# Patient Record
Sex: Female | Born: 1980 | Race: Black or African American | Hispanic: No | Marital: Single | Smoking: Former smoker
Health system: Southern US, Community
[De-identification: ages and names within clinical notes are randomized; demographics above are authoritative.]

## PROBLEM LIST (undated history)

## (undated) ENCOUNTER — Inpatient Hospital Stay (HOSPITAL_COMMUNITY): Payer: Self-pay

## (undated) DIAGNOSIS — A539 Syphilis, unspecified: Secondary | ICD-10-CM

## (undated) HISTORY — PX: NO PAST SURGERIES: SHX2092

---

## 2003-02-23 ENCOUNTER — Other Ambulatory Visit: Admission: RE | Admit: 2003-02-23 | Discharge: 2003-02-23 | Payer: Self-pay | Admitting: Obstetrics and Gynecology

## 2003-10-11 ENCOUNTER — Inpatient Hospital Stay (HOSPITAL_COMMUNITY): Admission: AD | Admit: 2003-10-11 | Discharge: 2003-10-12 | Payer: Self-pay | Admitting: Obstetrics and Gynecology

## 2003-10-25 ENCOUNTER — Ambulatory Visit (HOSPITAL_COMMUNITY): Admission: RE | Admit: 2003-10-25 | Discharge: 2003-10-25 | Payer: Self-pay | Admitting: Obstetrics and Gynecology

## 2003-10-27 ENCOUNTER — Inpatient Hospital Stay (HOSPITAL_COMMUNITY): Admission: AD | Admit: 2003-10-27 | Discharge: 2003-10-29 | Payer: Self-pay | Admitting: Obstetrics and Gynecology

## 2010-08-31 ENCOUNTER — Emergency Department (HOSPITAL_BASED_OUTPATIENT_CLINIC_OR_DEPARTMENT_OTHER)
Admission: EM | Admit: 2010-08-31 | Discharge: 2010-08-31 | Disposition: A | Discharge status: Home / Self Care | Payer: Medicaid Other | Attending: Emergency Medicine | Admitting: Emergency Medicine

## 2010-08-31 ENCOUNTER — Encounter: Payer: Self-pay | Admitting: *Deleted

## 2010-08-31 DIAGNOSIS — Z202 Contact with and (suspected) exposure to infections with a predominantly sexual mode of transmission: Secondary | ICD-10-CM | POA: Insufficient documentation

## 2010-08-31 DIAGNOSIS — N898 Other specified noninflammatory disorders of vagina: Secondary | ICD-10-CM | POA: Insufficient documentation

## 2010-08-31 DIAGNOSIS — O26899 Other specified pregnancy related conditions, unspecified trimester: Secondary | ICD-10-CM | POA: Insufficient documentation

## 2010-08-31 HISTORY — DX: Syphilis, unspecified: A53.9

## 2010-08-31 LAB — WET PREP, GENITAL: Trich, Wet Prep: NONE SEEN

## 2010-08-31 MED ORDER — CEFTRIAXONE SODIUM 250 MG IJ SOLR
250.0000 mg | Freq: Once | INTRAMUSCULAR | Status: AC
  Administered 2010-08-31: 250 mg via INTRAMUSCULAR
  Filled 2010-08-31: qty 250

## 2010-08-31 MED ORDER — LIDOCAINE HCL (PF) 1 % IJ SOLN
INTRAMUSCULAR | Status: AC
  Administered 2010-08-31: 05:00:00
  Filled 2010-08-31: qty 5

## 2010-08-31 MED ORDER — AZITHROMYCIN 1 G PO PACK
1.0000 g | PACK | Freq: Once | ORAL | Status: AC
  Administered 2010-08-31: 1 g via ORAL
  Filled 2010-08-31: qty 1

## 2010-08-31 NOTE — ED Notes (Signed)
Pt's partner was treated for Gonorrhea yesterday. Here for treatment. Pt is 6months pregnant.

## 2010-08-31 NOTE — ED Notes (Signed)
Pt states that her SO was tx for a STD and she is concerned states that she has had a vaginal DC greater than normal

## 2010-08-31 NOTE — ED Provider Notes (Addendum)
History     Chief Complaint  Patient presents with  . Exposure to STD   Patient is a 30 y.o. female presenting with vaginal discharge. The history is provided by the patient (The patient presents today requesting to be checked for an STD. She is currently since 6 months pregnant, gravida 4 para 2. Her partner has recently been treated for "gonorrhea". The patient is not sure whether she is having any abnormal discharge or not. ).  Vaginal Discharge The current episode started more than 2 days ago. Associated symptoms include abdominal pain. Pertinent negatives include no shortness of breath.    Past Medical History  Diagnosis Date  . Syphilis     History reviewed. No pertinent past surgical history.  History reviewed. No pertinent family history.  History  Substance Use Topics  . Smoking status: Former Games developer  . Smokeless tobacco: Not on file  . Alcohol Use: No    OB History    Grav Para Term Preterm Abortions TAB SAB Ect Mult Living   4 2   1            Review of Systems  Respiratory: Negative for shortness of breath.   Gastrointestinal: Positive for abdominal pain.  Genitourinary: Positive for vaginal discharge.  All other systems reviewed and are negative.    Physical Exam  BP 98/61  Pulse 80  Temp(Src) 98.3 F (36.8 C) (Oral)  Resp 16  Ht 5\' 5"  (1.651 m)  Wt 143 lb (64.864 kg)  BMI 23.80 kg/m2  Physical Exam  Constitutional: She appears well-developed and well-nourished. No distress.  HENT:  Head: Normocephalic.  Eyes: Pupils are equal, round, and reactive to light.  Cardiovascular: Normal heart sounds.   Pulmonary/Chest: Breath sounds normal.  Abdominal: Soft.  Genitourinary: Vagina normal.       Yellow discharge noted in the cervical area. No cervical motion tenderness. No bleeding noted no external lesions. The female nursing chaperone present fulguration of the entire examination.   Musculoskeletal: Normal range of motion.  Neurological: She is  alert.  Skin: Skin is warm and dry.    ED Course  Procedures  MDM Pt is seen and examined;  Initial history and physical completed.  Will follow.        Kit Mollett A. Patrica Duel, MD 08/31/10 1610  Lorelle Gibbs. Patrica Duel, MD 08/31/10 [id number]  Lorelle Gibbs. Patrica Duel, MD 08/31/10 [id number]

## 2010-09-01 LAB — GC/CHLAMYDIA PROBE AMP, GENITAL: GC Probe Amp, Genital: NEGATIVE

## 2013-10-05 ENCOUNTER — Encounter (HOSPITAL_BASED_OUTPATIENT_CLINIC_OR_DEPARTMENT_OTHER): Payer: Self-pay | Admitting: Emergency Medicine

## 2013-10-05 ENCOUNTER — Emergency Department (HOSPITAL_BASED_OUTPATIENT_CLINIC_OR_DEPARTMENT_OTHER)
Admission: EM | Admit: 2013-10-05 | Discharge: 2013-10-05 | Disposition: A | Discharge status: Home / Self Care | Payer: 59 | Attending: Emergency Medicine | Admitting: Emergency Medicine

## 2013-10-05 DIAGNOSIS — Z880 Allergy status to penicillin: Secondary | ICD-10-CM | POA: Diagnosis not present

## 2013-10-05 DIAGNOSIS — K089 Disorder of teeth and supporting structures, unspecified: Secondary | ICD-10-CM | POA: Diagnosis present

## 2013-10-05 DIAGNOSIS — Z8619 Personal history of other infectious and parasitic diseases: Secondary | ICD-10-CM | POA: Insufficient documentation

## 2013-10-05 DIAGNOSIS — K0889 Other specified disorders of teeth and supporting structures: Secondary | ICD-10-CM

## 2013-10-05 DIAGNOSIS — Z87891 Personal history of nicotine dependence: Secondary | ICD-10-CM | POA: Diagnosis not present

## 2013-10-05 MED ORDER — OXYCODONE-ACETAMINOPHEN 5-325 MG PO TABS: 1.0000 | ORAL_TABLET | ORAL | Status: DC | PRN

## 2013-10-05 NOTE — ED Provider Notes (Signed)
CSN: [id number]     Arrival date & time 10/05/13  1625 History   First MD Initiated Contact with Patient 10/05/13 1641     Chief Complaint  Patient presents with  . Dental Pain     (Consider location/radiation/quality/duration/timing/severity/associated sxs/prior Treatment) Patient is a 33 y.o. female presenting with tooth pain. The history is provided by the patient. No language interpreter was used.  Dental Pain Location:  Upper Upper teeth location:  1/RU 3rd molar Associated symptoms: no facial swelling, no fever and no headaches   Associated symptoms comment:  She is having pain at the partially erupted upper right molar. No facial swelling, fever.   Past Medical History  Diagnosis Date  . Syphilis    History reviewed. No pertinent past surgical history. History reviewed. No pertinent family history. History  Substance Use Topics  . Smoking status: Former Games developer  . Smokeless tobacco: Not on file  . Alcohol Use: No   OB History   Grav Para Term Preterm Abortions TAB SAB Ect Mult Living   Review of Systems  Constitutional: Negative for fever and chills.  HENT: Positive for dental problem. Negative for facial swelling and trouble swallowing.   Gastrointestinal: Negative.  Negative for nausea.  Neurological: Negative.  Negative for headaches.      Allergies  Penicillins  Home Medications   Prior to Admission medications   Not on File   BP 135/93  Pulse 91  Temp(Src) 98.5 F (36.9 C)  Resp 16  Ht  (1.651 m)  Wt 158 lb (71.668 kg)  BMI 26.29 kg/m2  SpO2 100%  Breastfeeding? Unknown Physical Exam  Constitutional: She is oriented to person, place, and time. She appears well-developed and well-nourished.  HENT:  Partially edentulous with upper partial plate. #1 not fully erupted. No surrounding inflammation or abscess formation.  Neck: Normal range of motion.  Pulmonary/Chest: Effort normal.  Neurological: She is alert and oriented  to person, place, and time.  Skin: Skin is warm and dry.    ED Course  Procedures (including critical care time) Labs Review Labs Reviewed - No data to display  Imaging Review No results found.   EKG Interpretation None      MDM   Final diagnoses:  None    1. Dental pain  Will provide pain management for dental pain. Encouraged follow up with dentistry. Do not suspect abscess.     Arnoldo Hooker, PA-C 10/05/13 1713

## 2013-10-05 NOTE — ED Provider Notes (Signed)
Medical screening examination/treatment/procedure(s) were performed by non-physician practitioner and as supervising physician I was immediately available for consultation/collaboration.   EKG Interpretation None        Richardean Canal, MD 10/05/13 2333

## 2013-10-05 NOTE — Discharge Instructions (Signed)
Dental Pain °A tooth ache may be caused by cavities (tooth decay). Cavities expose the nerve of the tooth to air and hot or cold temperatures. It may come from an infection or abscess (also called a boil or furuncle) around your tooth. It is also often caused by dental caries (tooth decay). This causes the pain you are having. °DIAGNOSIS  °Your caregiver can diagnose this problem by exam. °TREATMENT  °· If caused by an infection, it may be treated with medications which kill germs (antibiotics) and pain medications as prescribed by your caregiver. Take medications as directed. °· Only take over-the-counter or prescription medicines for pain, discomfort, or fever as directed by your caregiver. °· Whether the tooth ache today is caused by infection or dental disease, you should see your dentist as soon as possible for further care. °SEEK MEDICAL CARE IF: °The exam and treatment you received today has been provided on an emergency basis only. This is not a substitute for complete medical or dental care. If your problem worsens or new problems (symptoms) appear, and you are unable to meet with your dentist, call or return to this location. °SEEK IMMEDIATE MEDICAL CARE IF:  °· You have a fever. °· You develop redness and swelling of your face, jaw, or neck. °· You are unable to open your mouth. °· You have severe pain uncontrolled by pain medicine. °MAKE SURE YOU:  °· Understand these instructions. °· Will watch your condition. °· Will get help right away if you are not doing well or get worse. °Document Released: 01/21/2005 Document Revised: 04/15/2011 Document Reviewed: 09/09/2007 °ExitCare® Patient Information ©2015 [employer]. This information is not intended to replace advice given to you by your health care provider. Make sure you discuss any questions you have with your health care provider. ° °Emergency Department Resource Guide °1) Find a Doctor and Pay Out of Pocket °Although you won't have to find out who  is covered by your insurance plan, it is a good idea to ask around and get recommendations. You will then need to call the office and see if the doctor you have chosen will accept you as a new patient and what types of options they offer for patients who are self-pay. Some doctors offer discounts or will set up payment plans for their patients who do not have insurance, but you will need to ask so you aren't surprised when you get to your appointment. ° °2) Contact Your Local Health Department °Not all health departments have doctors that can see patients for sick visits, but many do, so it is worth a call to see if yours does. If you don't know where your local health department is, you can check in your phone book. The CDC also has a tool to help you locate your state's health department, and many state websites also have listings of all of their local health departments. ° °3) Find a Walk-in Clinic °If your illness is not likely to be very severe or complicated, you may want to try a walk in clinic. These are popping up all over the country in pharmacies, drugstores, and shopping centers. They're usually staffed by nurse practitioners or physician assistants that have been trained to treat common illnesses and complaints. They're usually fairly quick and inexpensive. However, if you have serious medical issues or chronic medical problems, these are probably not your best option. ° °No Primary Care Doctor: °- Call Health Connect at  [phone] - they can help you locate a primary   care doctor that  accepts your insurance, provides certain services, etc. - Physician Referral [phone]    Dental Care: Organization         Address  Phone  Notes  [facility] Department of [facility] [address] [place], [place] [phone] Accepts children up to age 15 who are enrolled in [place] or [place] Health Choice; pregnant women with a Medicaid card; and children who have  applied for Medicaid or [place] Health Choice, but were declined, whose parents can pay a reduced fee at time of service.  [facility] Department of [facility]  [address], [place] [phone] Accepts children up to age 69 who are enrolled in [place] or [place] Health Choice; pregnant women with a Medicaid card; and children who have applied for Medicaid or [place] Health Choice, but were declined, whose parents can pay a reduced fee at time of service.  [facility]  [address] [place], [place] [phone] Patients are seen by appointment only. Walk-ins are not accepted. [facility] will see patients 36 years of age and older. Monday - Tuesday (8am-5pm) Most Wednesdays (8:30-5pm) $30 per visit, cash only  [facility]  [address], [facility] [phone] Patients are seen by appointment only. Walk-ins are not accepted. [facility] will see patients 69 years of age and older. One Wednesday Evening (Monthly: Volunteer Based).  $30 per visit, cash only  [employer]  [phone] for adults; Children under age 5, call Graduate Pediatric Dentistry at [phone]. Children aged 46-14, please call [phone] to request a pediatric application.  Dental services are provided in all areas of dental care including fillings, crowns and bridges, complete and partial dentures, implants, gum treatment, root canals, and extractions. Preventive care is also provided. Treatment is provided to both adults and children. Patients are selected via a lottery and there is often a waiting list.   [facility] [address] [place]  [phone] [email or web address]   [facility] [address] [place], [place] [phone], Ext. [phone] Second and Fourth Thursday of each month, opens at 6:30 AM; Clinic ends at 9 AM.  Patients are seen on a first-come first-served basis, and a  limited number are seen during each clinic.   [facility]  [address] [place], [place] [phone]   Eligibility Requirements You must have lived in [place], [place], or [place] counties for at least the last three months.   You cannot be eligible for state or federal sponsored [place], including [employer], [place], or [employer].   You generally cannot be eligible for healthcare insurance through your employer.    How to apply: Eligibility screenings are held every Tuesday and Wednesday afternoon from 1:00 pm until 4:00 pm. You do not need an appointment for the interview!  [facility] [address] [place], [place] [phone]   [facility] Health Department  [phone]   [facility] Health Department  [phone]   [facility] Health Department  [phone]

## 2013-10-05 NOTE — ED Notes (Signed)
Pt c/o dental pain x 4 days 

## 2013-12-06 ENCOUNTER — Encounter (HOSPITAL_BASED_OUTPATIENT_CLINIC_OR_DEPARTMENT_OTHER): Payer: Self-pay | Admitting: Emergency Medicine

## 2015-03-01 DIAGNOSIS — Z3046 Encounter for surveillance of implantable subdermal contraceptive: Secondary | ICD-10-CM | POA: Diagnosis not present

## 2015-03-09 DIAGNOSIS — Z01419 Encounter for gynecological examination (general) (routine) without abnormal findings: Secondary | ICD-10-CM | POA: Diagnosis not present

## 2015-03-09 DIAGNOSIS — Z1151 Encounter for screening for human papillomavirus (HPV): Secondary | ICD-10-CM | POA: Diagnosis not present

## 2015-03-09 DIAGNOSIS — Z113 Encounter for screening for infections with a predominantly sexual mode of transmission: Secondary | ICD-10-CM | POA: Diagnosis not present

## 2017-01-09 ENCOUNTER — Telehealth: Payer: Self-pay | Admitting: General Practice

## 2017-01-09 DIAGNOSIS — Z331 Pregnant state, incidental: Secondary | ICD-10-CM

## 2017-01-09 DIAGNOSIS — N923 Ovulation bleeding: Secondary | ICD-10-CM

## 2017-01-09 NOTE — Telephone Encounter (Signed)
[facility] called and left message on nurse line stating patient came in with positive pregnancy test and is 7 weeks 5 days by LMP (11/15/16). Patient has been spotting every day since last LMP. They are calling asking about recommended follow up. Ultrasound was not done.  Per Dr Earlene Plateravis, should order ultrasound on patient. Scheduled for 12/11 @ 8am. Called & [facility] appt. Called patient, no answer- left message stating we are trying to reach you regarding an appt we have scheduled for you for Tuesday please call us back for more information

## 2017-01-14 ENCOUNTER — Ambulatory Visit (HOSPITAL_COMMUNITY): Admission: RE | Admit: 2017-01-14 | Payer: Medicaid Other | Source: Ambulatory Visit

## 2017-01-17 ENCOUNTER — Ambulatory Visit (HOSPITAL_COMMUNITY)
Admission: RE | Admit: 2017-01-17 | Discharge: 2017-01-17 | Disposition: A | Discharge status: Home / Self Care | Payer: Medicaid Other | Source: Ambulatory Visit | Attending: Obstetrics and Gynecology | Admitting: Obstetrics and Gynecology

## 2017-01-17 ENCOUNTER — Encounter (HOSPITAL_COMMUNITY): Payer: Self-pay | Admitting: *Deleted

## 2017-01-17 ENCOUNTER — Inpatient Hospital Stay (HOSPITAL_COMMUNITY)
Admission: AD | Admit: 2017-01-17 | Discharge: 2017-01-17 | Disposition: A | Discharge status: Home / Self Care | Payer: Medicaid Other | Source: Ambulatory Visit | Attending: Family Medicine | Admitting: Family Medicine

## 2017-01-17 DIAGNOSIS — Z3202 Encounter for pregnancy test, result negative: Secondary | ICD-10-CM | POA: Insufficient documentation

## 2017-01-17 DIAGNOSIS — Z331 Pregnant state, incidental: Secondary | ICD-10-CM | POA: Diagnosis present

## 2017-01-17 DIAGNOSIS — N923 Ovulation bleeding: Secondary | ICD-10-CM | POA: Insufficient documentation

## 2017-01-17 DIAGNOSIS — N939 Abnormal uterine and vaginal bleeding, unspecified: Secondary | ICD-10-CM | POA: Insufficient documentation

## 2017-01-17 LAB — ABO/RH: ABO/RH(D): B POS

## 2017-01-17 LAB — HCG, QUANTITATIVE, PREGNANCY: hCG, Beta Chain, Quant, S: 1 m[IU]/mL (ref <5)

## 2017-01-17 NOTE — Discharge Instructions (Signed)
Abnormal Uterine Bleeding Abnormal uterine bleeding can affect women at various stages in life, including teenagers, women in their reproductive years, pregnant women, and women who have reached menopause. Several kinds of uterine bleeding are considered abnormal, including:  Bleeding or spotting between periods.  Bleeding after sexual intercourse.  Bleeding that is heavier or more than normal.  Periods that last longer than usual.  Bleeding after menopause. Many cases of abnormal uterine bleeding are minor and simple to treat, while others are more serious. Any type of abnormal bleeding should be evaluated by your health care provider. Treatment will depend on the cause of the bleeding. Follow these instructions at home: Monitor your condition for any changes. The following actions may help to alleviate any discomfort you are experiencing:  Avoid the use of tampons and douches as directed by your health care provider.  Change your pads frequently. You should get regular pelvic exams and Pap tests. Keep all follow-up appointments for diagnostic tests as directed by your health care provider. Contact a health care provider if:  Your bleeding lasts more than 1 week.  You feel dizzy at times. Get help right away if:  You pass out.  You are changing pads every 15 to 30 minutes.  You have abdominal pain.  You have a fever.  You become sweaty or weak.  You are passing large blood clots from the vagina.  You start to feel nauseous and vomit. This information is not intended to replace advice given to you by your health care provider. Make sure you discuss any questions you have with your health care provider. Document Released: 01/21/2005 Document Revised: 07/05/2015 Document Reviewed: 08/20/2012 Elsevier Interactive Patient Education  2017 Elsevier Inc.  

## 2017-01-17 NOTE — MAU Provider Note (Signed)
History:  Maria Chapman is a 36 y.o. G5P0010 non pregnant female who presents from the ultrasound department for a HCG level. She states she was seen at the pregnancy care center last week and had a positive UPT. She had an ultrasound at the pregnancy care center that saw no IUP in her uterus and they sent her [facility]. [facility] ultrasound also saw no IUP and she was sent to MAU for a HCG. She reports intermittent spotting when she wipes since Thanksgiving. Denies any pain or vaginal discharge.  The following portions of the patient's history were reviewed and updated as appropriate: allergies, current medications, family history, past medical history, social history, past surgical history and problem list.  Review of Systems:  Review of Systems  Constitutional: Negative.  Negative for chills and fever.  Respiratory: Negative.  Negative for shortness of breath.   Cardiovascular: Negative.  Negative for chest pain.  Gastrointestinal: Negative.  Negative for abdominal pain.  Genitourinary:       Vaginal bleeding  Neurological: Negative.  Negative for dizziness and headaches.      Objective:  Physical Exam BP 118/70 (BP Location: Right Arm)   Pulse 81   Temp 98.8 F (37.1 C) (Oral)   Resp 16   Ht 5\' 5"  (1.651 m)   Wt 150 lb (68 kg)   LMP 11/15/2016   SpO2 100%   BMI 24.96 kg/m  Physical Exam  Constitutional: She appears well-developed and well-nourished. No distress.  Cardiovascular: Normal rate and regular rhythm.  Pulmonary/Chest: Effort normal and breath sounds normal. No respiratory distress.  Abdominal: Soft. There is no tenderness.  Nursing note and vitals reviewed.  Labs and Imaging Results for orders placed or performed during the hospital encounter of 01/17/17 (from the past 24 hour(s))  hCG, quantitative, pregnancy     Status: None   Collection Time: 01/17/17  2:24 PM  Result Value Ref Range   hCG, Beta Chain, Quant, S 1 <5 mIU/mL    [place] Ob  Comp Less 14 Wks  Result Date: 01/17/2017 CLINICAL DATA:  Vaginal spotting EXAM: OBSTETRIC <14 WK US AND TRANSVAGINAL OB US TECHNIQUE: Both transabdominal and transvaginal ultrasound examinations were performed for complete evaluation of the gestation as well as the maternal uterus, adnexal regions, and pelvic cul-de-sac. Transvaginal technique was performed to assess early pregnancy. COMPARISON:  None. FINDINGS: Intrauterine gestational sac: None visualized Yolk sac:  Not visualized Embryo:  Not visualized Cardiac Activity: Heart Rate:   bpm MSD:   mm    w     d CRL:    mm    w    d                  US EDC: Subchorionic hemorrhage:  N/A Maternal uterus/adnexae: No adnexal mass or free fluid. IMPRESSION: No intrauterine pregnancy visualized. Differential considerations would include early intrauterine pregnancy too early to visualize, spontaneous abortion, or occult ectopic pregnancy. Recommend close clinical followup and serial quantitative beta HCGs and ultrasounds. Electronically Signed   By: Charlett NoseKevin  Dover M.D.   On: 01/17/2017 13:51   [place] Ob Transvaginal  Result Date: 01/17/2017 CLINICAL DATA:  Vaginal spotting EXAM: OBSTETRIC <14 WK US AND TRANSVAGINAL OB US TECHNIQUE: Both transabdominal and transvaginal ultrasound examinations were performed for complete evaluation of the gestation as well as the maternal uterus, adnexal regions, and pelvic cul-de-sac. Transvaginal technique was performed to assess early pregnancy. COMPARISON:  None. FINDINGS: Intrauterine gestational sac: None  visualized Yolk sac:  Not visualized Embryo:  Not visualized Cardiac Activity: Heart Rate:   bpm MSD:   mm    w     d CRL:    mm    w    d                  US EDC: Subchorionic hemorrhage:  N/A Maternal uterus/adnexae: No adnexal mass or free fluid. IMPRESSION: No intrauterine pregnancy visualized. Differential considerations would include early intrauterine pregnancy too early to visualize, spontaneous abortion, or occult  ectopic pregnancy. Recommend close clinical followup and serial quantitative beta HCGs and ultrasounds. Electronically Signed   By: Charlett NoseKevin  Dover M.D.   On: 01/17/2017 13:51   Negative HCG- discussed with patient negative result meaning patient isn't pregnant  Assessment & Plan:   1. Pregnancy examination or test, negative result   2. Vaginal spotting    -Discharge home in stable condition -Lengthy discussion of pregnancy tests and HCG levels. Reassurance provided and all results reviewed -[facility] but [facility]. -Vaginal bleeding precautions discussed -Patient advised to follow-up with OB/GYN of choice as needed -Patient may return to MAU as needed or if her condition were to change or worsen  Rolm Bookbindereill, Caroline M, [place] 01/17/2017 3:40 PM

## 2017-01-17 NOTE — MAU Note (Signed)
Pt presents from u/s per u/s for no IUP. Has had some spotting before but no bleeding or pain today.

## 2017-01-22 NOTE — Telephone Encounter (Signed)
Pt was seen in MAU, see note.

## 2017-02-04 NOTE — L&D Delivery Note (Signed)
Delivery Note At 9:41 PM a viable female was delivered via Vaginal, Spontaneous (Presentation: vertex, ROA).  APGAR: 9, 9; weight pending.   Placenta status: not delivered.  Cord: 3vc with the following complications: none.  Baby in transverse position, head maternal left. Arm came down into vagina and not able to reduce. Will move to cesarean delivery.   The risks of cesarean section discussed with the patient included but were not limited to: bleeding which may require transfusion or reoperation; infection which may require antibiotics; injury to bowel, bladder, ureters or other surrounding organs; injury to the fetus; need for additional procedures including hysterectomy in the event of a life-threatening hemorrhage; placental abnormalities wth subsequent pregnancies, incisional problems, thromboembolic phenomenon and other postoperative/anesthesia complications. The patient concurred with the proposed plan, giving informed written consent for the procedure.   Patient has been NPO since this morning, she will remain NPO for procedure. Anesthesia and OR aware.  Preoperative prophylactic Ancef and azithromycin ordered on call to the OR.  To OR when ready.  Levie HeritageStinson, Mada Sadik J, DO 10/28/2017 10:08 PM    Anesthesia:  Fentanyl Episiotomy: None Lacerations:  Will examine following cesarean delivery. Est. Blood Loss (mL): 200   Mom to postpartum.  Baby to Nursery.  Levie HeritageJacob J Syriana Croslin 10/28/2017, 10:05 PM

## 2017-03-10 DIAGNOSIS — Z3490 Encounter for supervision of normal pregnancy, unspecified, unspecified trimester: Secondary | ICD-10-CM | POA: Insufficient documentation

## 2017-03-18 DIAGNOSIS — O30049 Twin pregnancy, dichorionic/diamniotic, unspecified trimester: Secondary | ICD-10-CM | POA: Insufficient documentation

## 2017-04-18 DIAGNOSIS — O09529 Supervision of elderly multigravida, unspecified trimester: Secondary | ICD-10-CM | POA: Insufficient documentation

## 2017-04-18 LAB — OB RESULTS CONSOLE HEPATITIS B SURFACE ANTIGEN: Hepatitis B Surface Ag: NEGATIVE

## 2017-04-19 LAB — OB RESULTS CONSOLE RUBELLA ANTIBODY, IGM: Rubella: IMMUNE

## 2017-04-19 LAB — OB RESULTS CONSOLE RPR: RPR: NONREACTIVE

## 2017-04-21 DIAGNOSIS — O99320 Drug use complicating pregnancy, unspecified trimester: Secondary | ICD-10-CM | POA: Insufficient documentation

## 2017-06-09 ENCOUNTER — Encounter: Payer: Self-pay | Admitting: Family Medicine

## 2017-06-09 ENCOUNTER — Ambulatory Visit (INDEPENDENT_AMBULATORY_CARE_PROVIDER_SITE_OTHER): Payer: Medicaid Other | Admitting: Family Medicine

## 2017-06-09 VITALS — BP 107/72 | HR 89 | Wt 155.0 lb

## 2017-06-09 DIAGNOSIS — O0990 Supervision of high risk pregnancy, unspecified, unspecified trimester: Secondary | ICD-10-CM

## 2017-06-09 DIAGNOSIS — O30042 Twin pregnancy, dichorionic/diamniotic, second trimester: Secondary | ICD-10-CM | POA: Diagnosis not present

## 2017-06-09 DIAGNOSIS — O99322 Drug use complicating pregnancy, second trimester: Secondary | ICD-10-CM | POA: Diagnosis not present

## 2017-06-09 DIAGNOSIS — O09522 Supervision of elderly multigravida, second trimester: Secondary | ICD-10-CM

## 2017-06-09 NOTE — Progress Notes (Signed)
  Subjective:  Maria Chapman is a [id number] [redacted]w[redacted]d being seen today for her first obstetrical visit. She had started to receive care at [facility], but transferred here due to wanting to deliver at [facility]. Her obstetrical history is significant for advanced maternal age and twin pregnancy. Patient does intend to breast feed. Pregnancy history fully reviewed.  Patient reports continued morning sickness.  BP 107/72   Pulse 89   Wt 155 lb (70.3 kg)   LMP 02/09/2017   BMI 25.79 kg/m   HISTORY: OB History  Gravida Para Term Preterm AB Living  SAB TAB Ectopic Multiple Live Births    1     3    # Outcome Date GA Lbr Len/2nd Weight Sex Delivery Anes PTL Lv  6 Current           5 Molar 12/2016             Birth Comments: System Generated. Please review and update pregnancy details.  4 Term 12/21/10    M Vag-Spont   LIV  3 Term 01/04/09    F Vag-Spont   LIV  2 TAB 2006          1 Term 10/27/03     Vag-Spont       Past Medical History:  Diagnosis Date  . Syphilis     History reviewed. No pertinent surgical history.  Family History  Problem Relation Age of Onset  . Cancer Maternal Aunt 30       breast CA  . Diabetes Maternal Grandmother   . Cancer Maternal Grandfather   . Hypertension Neg Hx      Exam    Uterus:     Pelvic Exam: Done previously  System: Breast:  Done previously   Skin: normal coloration and turgor, no rashes    Neurologic: gait normal; reflexes normal and symmetric   Extremities: normal strength, tone, and muscle mass   HEENT PERRLA and extra ocular movement intact   Mouth/Teeth mucous membranes moist, pharynx normal without lesions   Neck supple   Cardiovascular: regular rate and rhythm   Respiratory:  appears well, vitals normal, no respiratory distress, acyanotic, normal RR   Abdomen: soft, non-tender; bowel sounds normal; no masses,  no organomegaly      Assessment:    Pregnancy: [id number] Patient Active Problem  List   Diagnosis Date Noted  . Supervision of high risk pregnancy, antepartum 06/09/2017  . Drug use affecting pregnancy 04/21/2017  . AMA (advanced maternal age) multigravida 35+ 04/18/2017  . Dichorionic diamniotic twin pregnancy 03/18/2017      Plan:   1. Supervision of high risk pregnancy, antepartum AFP today. Will arrange [place].  Discussed nature of practice, delivery at [facility]. - AFP TETRA - [place] MFM OB DETAIL +14 WK; Future  2. Drug use affecting pregnancy in second trimester Had THC on UDS.  3. Dichorionic diamniotic twin pregnancy in second trimester Discussed plan for serial [place] for growth, antenatal testing after 35 weeks, delivery between 37-38 weeks. - AFP TETRA - [place] MFM OB DETAIL +14 WK; Future  4. Multigravida of advanced maternal age in second trimester First screen done. AFP today. - AFP TETRA - [place] MFM OB DETAIL +14 WK; Future     Problem list reviewed and updated. 75% of 45 min visit spent on counseling and coordination of care.     Levie Heritage 06/09/2017

## 2017-06-09 NOTE — Progress Notes (Signed)
Patient has had care at [facility]- twin pregnancy Maria Chapman

## 2017-06-11 LAB — AFP TETRA
DIA MOM VALUE: 2.61
DIA Value (EIA): 437.84 pg/mL
DSR (By Age)    1 IN: 261
DSR (SECOND TRIMESTER) 1 IN: 1624
Gestational Age: 17.1 WEEKS
MATERNAL AGE AT EDD: 35.6 a
MSAFP Mom: 3.12
MSAFP: 126.2 ng/mL
MSHCG Mom: 2.67
MSHCG: 88200 m[IU]/mL
OSB RISK: 649
T18 (By Age): 1:1018 {titer}
Test Results:: NEGATIVE
UE3 MOM: 1.54
WEIGHT: 155 [lb_av]
uE3 Value: 1.61 ng/mL

## 2017-06-20 ENCOUNTER — Other Ambulatory Visit (HOSPITAL_COMMUNITY): Payer: Self-pay | Admitting: *Deleted

## 2017-06-23 ENCOUNTER — Ambulatory Visit (HOSPITAL_COMMUNITY)
Admission: RE | Admit: 2017-06-23 | Discharge: 2017-06-23 | Disposition: A | Discharge status: Home / Self Care | Payer: Medicaid Other | Source: Ambulatory Visit | Attending: Family Medicine | Admitting: Family Medicine

## 2017-06-23 ENCOUNTER — Encounter (HOSPITAL_COMMUNITY): Payer: Self-pay

## 2017-06-23 ENCOUNTER — Other Ambulatory Visit: Payer: Self-pay | Admitting: Family Medicine

## 2017-06-23 DIAGNOSIS — O0990 Supervision of high risk pregnancy, unspecified, unspecified trimester: Secondary | ICD-10-CM

## 2017-06-23 DIAGNOSIS — Z3A19 19 weeks gestation of pregnancy: Secondary | ICD-10-CM | POA: Insufficient documentation

## 2017-06-23 DIAGNOSIS — O09522 Supervision of elderly multigravida, second trimester: Secondary | ICD-10-CM | POA: Diagnosis not present

## 2017-06-23 DIAGNOSIS — O30042 Twin pregnancy, dichorionic/diamniotic, second trimester: Secondary | ICD-10-CM | POA: Diagnosis not present

## 2017-06-23 DIAGNOSIS — Z3689 Encounter for other specified antenatal screening: Secondary | ICD-10-CM | POA: Insufficient documentation

## 2017-06-23 DIAGNOSIS — F1210 Cannabis abuse, uncomplicated: Secondary | ICD-10-CM

## 2017-06-23 DIAGNOSIS — O0289 Other abnormal products of conception: Secondary | ICD-10-CM | POA: Diagnosis not present

## 2017-06-23 DIAGNOSIS — O0992 Supervision of high risk pregnancy, unspecified, second trimester: Secondary | ICD-10-CM | POA: Insufficient documentation

## 2017-06-23 DIAGNOSIS — Z363 Encounter for antenatal screening for malformations: Secondary | ICD-10-CM | POA: Diagnosis not present

## 2017-06-23 DIAGNOSIS — O09529 Supervision of elderly multigravida, unspecified trimester: Secondary | ICD-10-CM

## 2017-06-23 NOTE — Progress Notes (Signed)
Genetic Counseling  High-Risk Gestation Note  Appointment Date:  06/23/2017 Referred By: Levie Heritage, DO Date of Birth:  [birth year] Partner:  Thompson Caul   Pregnancy History: [id number] Estimated Date of Delivery: 11/16/17 Estimated Gestational Age: [redacted]w[redacted]d Attending: Damaris Hippo, MD   Ms.Ma Chapman was seen for genetic counseling because of a maternal age of 37 y.o. in a dichorionic/diamniotic twin gestation. Her mother and sister accompanied her to today's visit.      In summary:  Discussed AMA and associated risk for fetal aneuploidy  Reviewed results of previous screening  First trimester screening within normal limits for Down syndrome (1 in 7600 DSR)  Quad screen subsequently performed, also within normal limits for Down syndrome (1 in 1624)  Other aneuploidy not assessed in twin gestation from maternal serum screening  Discussed options for screening  NIPS- declined  Ultrasound- performed today; see separate report  Discussed diagnostic testing options  Amniocentesis- declined  Reviewed family history concerns   She was counseled regarding maternal age and the association with risk for chromosome conditions due to nondisjunction with aging of the ova.   We reviewed chromosomes, nondisjunction, and the associated 1 in 101 risk for fetal aneuploidy related to a maternal age of 37 y.o. at [redacted]w[redacted]d gestation in a twin gestation.  She was counseled that the risk for aneuploidy decreases as gestational age increases, accounting for those pregnancies which spontaneously abort.  We specifically discussed Down syndrome (trisomy 36), trisomies 52 and 13, and sex chromosome aneuploidies (47,XXX and 47,XXY) including the common features and prognoses of each.   [name] previously had first trimester screening performed through her initial OB provider, which was within normal limits, reducing the risk for fetal Down syndrome from her age related risk to 1 in 37. [name]  also had Quad screen performed, which was within normal limits for Down syndrome, reducing the risk from her age risk to 1 in 66. We reviewed methodology and sensitivity of maternal serum screening in a twin gestation. She understands that trisomy 56 risk is not adjusted from maternal serum screening in a twin gestation and other fetal trisomies are not assessed.   We reviewed available screening options including noninvasive prenatal screening (NIPS)/cell free DNA (cfDNA) screening and detailed ultrasound.  She was counseled that screening tests are used to modify a patient's a priori risk for aneuploidy, typically based on age. This estimate provides a pregnancy specific risk assessment. We reviewed the benefits and limitations of each option. Specifically, we discussed the conditions for which each test screens, the detection rates, and false positive rates of each. She was also counseled regarding diagnostic testing via amniocentesis. We reviewed the approximate 1 in 300-500 risk for complications from amniocentesis, including spontaneous pregnancy loss. We discussed the possible results that the tests might provide including: positive, negative, unanticipated, and no result. Finally, they were counseled regarding the cost of each option and potential out of pocket expenses. After consideration of all the options, she elected ultrasound only and declined NIPS and amniocentesis.    A complete ultrasound was performed today. The ultrasound report will be sent under separate cover. There were no visualized fetal anomalies or markers suggestive of aneuploidy. Diagnostic testing was declined today.  She understands that screening tests cannot rule out all birth defects or genetic syndromes. The patient was advised of this limitation and states she still does not want additional testing at this time.   Maria Chapman was provided with written information regarding  cystic fibrosis (CF), spinal muscular  atrophy (SMA) and hemoglobinopathies including the carrier frequency, availability of carrier screening and prenatal diagnosis if indicated.  In addition, we discussed that CF and hemoglobinopathies are routinely screened for as part of the [place] newborn screening panel, and newborn screening for SMA is available as an opt-in under a research protocol in [place] for families who enroll in Early Check.  She declined screening for CF, SMA and hemoglobinopathies.  Both family histories were reviewed and found to be noncontributory for birth defects, intellectual disability, and known genetic conditions. African American ancestry was reported for the couple. There is not known consanguinity for the couple. Without further information regarding the provided family history, an accurate genetic risk cannot be calculated. Further genetic counseling is warranted if more information is obtained.  [name] denied exposure to environmental toxins or chemical agents. She denied the use of alcohol, tobacco or street drugs. She denied significant viral illnesses during the course of her pregnancy.  I counseled [name] regarding the above risks and available options.  The approximate face-to-face time with the genetic counselor was 20 minutes.  Quinn Plowman, MS,  Certified Genetic Counselor 06/23/2017

## 2017-06-24 ENCOUNTER — Other Ambulatory Visit (HOSPITAL_COMMUNITY): Payer: Self-pay | Admitting: *Deleted

## 2017-06-24 DIAGNOSIS — O30049 Twin pregnancy, dichorionic/diamniotic, unspecified trimester: Secondary | ICD-10-CM

## 2017-07-07 ENCOUNTER — Ambulatory Visit (INDEPENDENT_AMBULATORY_CARE_PROVIDER_SITE_OTHER): Payer: Medicaid Other | Admitting: Obstetrics & Gynecology

## 2017-07-07 VITALS — BP 108/67 | HR 97 | Wt 158.4 lb

## 2017-07-07 DIAGNOSIS — O30042 Twin pregnancy, dichorionic/diamniotic, second trimester: Secondary | ICD-10-CM

## 2017-07-07 DIAGNOSIS — O0990 Supervision of high risk pregnancy, unspecified, unspecified trimester: Secondary | ICD-10-CM

## 2017-07-07 DIAGNOSIS — O99322 Drug use complicating pregnancy, second trimester: Secondary | ICD-10-CM

## 2017-07-07 DIAGNOSIS — O09522 Supervision of elderly multigravida, second trimester: Secondary | ICD-10-CM

## 2017-07-07 NOTE — Addendum Note (Signed)
Addended by: Lorelle GibbsWILSON, Soma Lizak L on: 07/07/2017 09:56 AM   Modules accepted: Orders

## 2017-07-07 NOTE — Progress Notes (Signed)
   PRENATAL VISIT NOTE  Subjective:  Maria Chapman is a 37 y.o. [phone] at 6651w1d being seen today for ongoing prenatal care.  She is currently monitored for the following issues for this high-risk pregnancy and has Supervision of high risk pregnancy, antepartum; AMA (advanced maternal age) multigravida 35+; Dichorionic diamniotic twin pregnancy; Drug use affecting pregnancy; and Marijuana abuse on their problem list.  Patient reports no complaints.  Contractions: Not present. Vag. Bleeding: None.  Movement: Present. Denies leaking of fluid.   The following portions of the patient's history were reviewed and updated as appropriate: allergies, current medications, past family history, past medical history, past social history, past surgical history and problem list. Problem list updated.  Objective:   Vitals:   07/07/17 0859  BP: 108/67  Pulse: 97  Weight: 158 lb 6.4 oz (71.8 kg)    Fetal Status: Fetal Heart Rate (bpm): 144/155   Movement: Present     General:  Alert, oriented and cooperative. Patient is in no acute distress.  Skin: Skin is warm and dry. No rash noted.   Cardiovascular: Normal heart rate noted  Respiratory: Normal respiratory effort, no problems with respiration noted  Abdomen: Soft, gravid, appropriate for gestational age.  Pain/Pressure: Absent     Pelvic: Cervical exam deferred        Extremities: Normal range of motion.  Edema: None  Mental Status: Normal mood and affect. Normal behavior. Normal judgment and thought content.   Assessment and Plan:  Pregnancy: [id number] at 3851w1d  1. Supervision of high risk pregnancy, antepartum  2. Dichorionic diamniotic twin pregnancy in second trimester Has monthly US for growth scheduled.   3. Multigravida of advanced maternal age in second trimester Pt declined NIPS and amniocentesis.  S/p genetics consult.   4. Drug use affecting pregnancy in second trimester No use since first trimester. Pt frustrated by this dx.  Requested repeat drug screen.    Preterm labor symptoms and general obstetric precautions including but not limited to vaginal bleeding, contractions, leaking of fluid and fetal movement were reviewed in detail with the patient. Please refer to After Visit Summary for other counseling recommendations.  Return in about 1 month (around 08/04/2017).  Future Appointments  Date Time Provider Department Center  07/21/2017 10:30 AM [facility]-M[facility] US 1 [facility]-MFCUS MFC-US  08/18/2017 10:30 AM [facility]-M[facility] US 1 [facility]-M[facility]US MFC-US  09/15/2017 10:30 AM [facility] US 1 [facility]-M[facility]US MFC-US  10/13/2017 10:30 AM WH-MFC US 1 WH-MFCUS MFC-US    Willodean Rosenthalarolyn Harraway-Smith, MD

## 2017-07-07 NOTE — Patient Instructions (Signed)
Second Trimester of Pregnancy The second trimester is from week 13 through week 28, month 4 through 6. This is often the time in pregnancy that you feel your best. Often times, morning sickness has lessened or quit. You may have more energy, and you may get hungry more often. Your unborn baby (fetus) is growing rapidly. At the end of the sixth month, he or she is about 9 inches long and weighs about 1 pounds. You will likely feel the baby move (quickening) between 18 and 20 weeks of pregnancy. Follow these instructions at home:  Avoid all smoking, herbs, and alcohol. Avoid drugs not approved by your doctor.  Do not use any tobacco products, including cigarettes, chewing tobacco, and electronic cigarettes. If you need help quitting, ask your doctor. You may get counseling or other support to help you quit.  Only take medicine as told by your doctor. Some medicines are safe and some are not during pregnancy.  Exercise only as told by your doctor. Stop exercising if you start having cramps.  Eat regular, healthy meals.  Wear a good support bra if your breasts are tender.  Do not use hot tubs, steam rooms, or saunas.  Wear your seat belt when driving.  Avoid raw meat, uncooked cheese, and liter boxes and soil used by cats.  Take your prenatal vitamins.  Take 1500-2000 milligrams of calcium daily starting at the 20th week of pregnancy until you deliver your baby.  Try taking medicine that helps you poop (stool softener) as needed, and if your doctor approves. Eat more fiber by eating fresh fruit, vegetables, and whole grains. Drink enough fluids to keep your pee (urine) clear or pale yellow.  Take warm water baths (sitz baths) to soothe pain or discomfort caused by hemorrhoids. Use hemorrhoid cream if your doctor approves.  If you have puffy, bulging veins (varicose veins), wear support hose. Raise (elevate) your feet for 15 minutes, 3-4 times a day. Limit salt in your diet.  Avoid heavy  lifting, wear low heals, and sit up straight.  Rest with your legs raised if you have leg cramps or low back pain.  Visit your dentist if you have not gone during your pregnancy. Use a soft toothbrush to brush your teeth. Be gentle when you floss.  You can have sex (intercourse) unless your doctor tells you not to.  Go to your doctor visits. Get help if:  You feel dizzy.  You have mild cramps or pressure in your lower belly (abdomen).  You have a nagging pain in your belly area.  You continue to feel sick to your stomach (nauseous), throw up (vomit), or have watery poop (diarrhea).  You have bad smelling fluid coming from your vagina.  You have pain with peeing (urination). Get help right away if:  You have a fever.  You are leaking fluid from your vagina.  You have spotting or bleeding from your vagina.  You have severe belly cramping or pain.  You lose or gain weight rapidly.  You have trouble catching your breath and have chest pain.  You notice sudden or extreme puffiness (swelling) of your face, hands, ankles, feet, or legs.  You have not felt the baby move in over an hour.  You have severe headaches that do not go away with medicine.  You have vision changes. This information is not intended to replace advice given to you by your health care provider. Make sure you discuss any questions you have with your health care   provider. Document Released: 04/17/2009 Document Revised: 06/29/2015 Document Reviewed: 03/24/2012 Elsevier Interactive Patient Education  2017 [employer] Multiple Pregnancy Having a multiple pregnancy means that a woman is carrying more than one baby at a time. She may be pregnant with twins, triplets, or more. The majority of multiple pregnancies are twins. Naturally conceiving triplets or more (higher-order multiples) is rare. Multiple pregnancies are riskier than single pregnancies. A woman with a multiple pregnancy is more likely to have  certain problems during her pregnancy. Therefore, she will need to have more frequent appointments for prenatal care. How does a multiple pregnancy happen? A multiple pregnancy happens when:  The woman's body releases more than one egg at a time, and then each egg gets fertilized by a different sperm. ? This is the most common type of multiple pregnancy. ? Twins or other multiples produced this way are fraternal. They are no more alike than non-multiple siblings are.  One sperm fertilizes one egg, which then divides into more than one embryo. ? Twins or other multiples produced this way are identical. Identical multiples are always the same gender, and they look very much alike.  Who is most likely to have a multiple pregnancy? A multiple pregnancy is more likely to develop in women who:  Have had fertility treatment, especially if the treatment included fertility drugs.  Are older than 35 years of age.  Have already had four or more children.  Have a family history of multiple pregnancy.  How is a multiple pregnancy diagnosed? A multiple pregnancy may be diagnosed based on:  Symptoms such as: ? Rapid weight gain in the first 3 months of pregnancy (first trimester). ? More severe nausea and breast tenderness than what is typical of a single pregnancy. ? The uterus measuring larger than what is normal for the stage of the pregnancy.  Blood tests that detect a higher-than-normal level of human chorionic gonadotropin (hCG). This is a hormone that your body produces in early pregnancy.  Ultrasound exam. This is used to confirm that you are carrying multiples.  What risks are associated with multiple pregnancy? A multiple pregnancy puts you at a higher risk for certain problems during or after your pregnancy, including:  Having your babies delivered before you have reached a full-term pregnancy (preterm birth). A full-term pregnancy lasts for at least 37 weeks. Babies born before 37  weeks may have a higher risk of a variety of health problems, such as breathing problems, feeding difficulties, cerebral palsy, and learning disabilities.  Diabetes.  Preeclampsia. This is a serious condition that causes high blood pressure along with other symptoms, such as swelling and headaches, during pregnancy.  Excessive blood loss after childbirth (postpartum hemorrhage).  Postpartum depression.  Low birth weight of the babies.  How will having a multiple pregnancy affect my care? Your health care provider will want to monitor you more closely during your pregnancy to make sure that your babies are growing normally and that you are healthy. Follow these instructions at home: Because your pregnancy is considered to be high risk, you will need to work closely with your health care team. You may also need to make some lifestyle changes. These may include the following: Eating and drinking  Increase your nutrition. ? Follow your health care provider's recommendations for weight gain. You may need to gain a little extra weight when you are pregnant with multiples. ? Eat healthy snacks often throughout the day. This can add calories and reduce nausea.  Drink enough  fluid to keep your urine clear or pale yellow.  Take prenatal vitamins. Activity By 20-24 weeks, you may need to limit your activities.  Avoid activities and work that take a lot of effort (are strenuous).  Ask your health care provider when you should stop having sexual intercourse.  Rest often.  General instructions  Do not use any products that contain nicotine or tobacco, such as cigarettes and e-cigarettes. If you need help quitting, ask your health care provider.  Do not drink alcohol or use illegal drugs.  Take over-the-counter and prescription medicines only as told by your health care provider.  Arrange for extra help around the house.  Keep all follow-up visits and all prenatal visits as told by your  health care provider. This is important. Contact a health care provider if:  You have dizziness.  You have persistent nausea, vomiting, or diarrhea.  You are having trouble gaining weight.  You have feelings of depression or other emotions that are interfering with your normal activities. Get help right away if:  You have a fever.  You have pain with urination.  You have fluid leaking from your vagina.  You have a bad-smelling vaginal discharge.  You notice increased swelling in your face, hands, legs, or ankles.  You have spotting or bleeding from your vagina.  You have pelvic cramps, pelvic pressure, or nagging pain in your abdomen or lower back.  You are having regular contractions.  You develop a severe headache, with or without visual changes.  You have shortness of breath or chest pain.  You notice less fetal movement, or no fetal movement. This information is not intended to replace advice given to you by your health care provider. Make sure you discuss any questions you have with your health care provider. Document Released: 10/31/2007 Document Revised: 09/22/2015 Document Reviewed: 09/22/2015 Elsevier Interactive Patient Education  [employer]2018 Elsevier Inc.

## 2017-07-21 ENCOUNTER — Encounter (HOSPITAL_COMMUNITY): Payer: Self-pay

## 2017-07-21 ENCOUNTER — Ambulatory Visit (HOSPITAL_COMMUNITY)
Admission: RE | Admit: 2017-07-21 | Discharge: 2017-07-21 | Disposition: A | Discharge status: Home / Self Care | Payer: Medicaid Other | Source: Ambulatory Visit | Attending: Family Medicine | Admitting: Family Medicine

## 2017-07-21 DIAGNOSIS — O30049 Twin pregnancy, dichorionic/diamniotic, unspecified trimester: Secondary | ICD-10-CM | POA: Diagnosis present

## 2017-07-21 DIAGNOSIS — O09522 Supervision of elderly multigravida, second trimester: Secondary | ICD-10-CM | POA: Diagnosis not present

## 2017-07-21 DIAGNOSIS — Z3A23 23 weeks gestation of pregnancy: Secondary | ICD-10-CM | POA: Diagnosis not present

## 2017-07-21 DIAGNOSIS — Z362 Encounter for other antenatal screening follow-up: Secondary | ICD-10-CM | POA: Insufficient documentation

## 2017-07-21 DIAGNOSIS — O0289 Other abnormal products of conception: Secondary | ICD-10-CM | POA: Insufficient documentation

## 2017-07-21 DIAGNOSIS — O30042 Twin pregnancy, dichorionic/diamniotic, second trimester: Secondary | ICD-10-CM | POA: Insufficient documentation

## 2017-07-21 NOTE — Addendum Note (Signed)
Encounter addended by: Emeline DarlingKiser, Avraham Benish E, RT on: 07/21/2017 12:30 PM  Actions taken: Imaging Exam ended

## 2017-08-04 ENCOUNTER — Ambulatory Visit (INDEPENDENT_AMBULATORY_CARE_PROVIDER_SITE_OTHER): Payer: Medicaid Other | Admitting: Obstetrics & Gynecology

## 2017-08-04 VITALS — BP 114/55 | HR 91 | Wt 162.8 lb

## 2017-08-04 DIAGNOSIS — O0990 Supervision of high risk pregnancy, unspecified, unspecified trimester: Secondary | ICD-10-CM

## 2017-08-04 DIAGNOSIS — O09522 Supervision of elderly multigravida, second trimester: Secondary | ICD-10-CM

## 2017-08-04 DIAGNOSIS — Z87898 Personal history of other specified conditions: Secondary | ICD-10-CM

## 2017-08-04 DIAGNOSIS — F1911 Other psychoactive substance abuse, in remission: Secondary | ICD-10-CM

## 2017-08-04 DIAGNOSIS — O30042 Twin pregnancy, dichorionic/diamniotic, second trimester: Secondary | ICD-10-CM

## 2017-08-04 NOTE — Progress Notes (Signed)
   PRENATAL VISIT NOTE  Subjective:  Maria Chapman is a 37 y.o. [phone] at 8867w1d being seen today for ongoing prenatal care.  She is currently monitored for the following issues for this high-risk pregnancy and has Supervision of high risk pregnancy, antepartum; AMA (advanced maternal age) multigravida 35+; Dichorionic diamniotic twin pregnancy; Drug use affecting pregnancy; and Marijuana abuse on their problem list.  Patient reports no complaints.  Contractions: Not present. Vag. Bleeding: None.  Movement: Present. Denies leaking of fluid.   The following portions of the patient's history were reviewed and updated as appropriate: allergies, current medications, past family history, past medical history, past social history, past surgical history and problem list. Problem list updated.  Objective:   Vitals:   08/04/17 0858  BP: (!) 114/55  Pulse: 91  Weight: 162 lb 12.8 oz (73.8 kg)    Fetal Status: Fetal Heart Rate (bpm): 144/154   Movement: Present     General:  Alert, oriented and cooperative. Patient is in no acute distress.  Skin: Skin is warm and dry. No rash noted.   Cardiovascular: Normal heart rate noted  Respiratory: Normal respiratory effort, no problems with respiration noted  Abdomen: Soft, gravid, appropriate for gestational age.  Pain/Pressure: Absent     Pelvic: Cervical exam performed        Extremities: Normal range of motion.  Edema: None  Mental Status: Normal mood and affect. Normal behavior. Normal judgment and thought content.   Assessment and Plan:  Pregnancy: [id number] at 2267w1d  1. Supervision of high risk pregnancy, antepartum  2. Dichorionic diamniotic twin pregnancy in second trimester 64th/65th %ile  3. Multigravida of advanced maternal age in second trimester  4. H/o THC use in first trimester. Pt wants a drug screen to show that she has not been using drugs.  Urine tox ordered today  Preterm labor symptoms and general obstetric precautions  including but not limited to vaginal bleeding, contractions, leaking of fluid and fetal movement were reviewed in detail with the patient. Please refer to After Visit Summary for other counseling recommendations.  Return in about 2 weeks (around 08/18/2017).  Future Appointments  Date Time Provider Department Center  08/18/2017 10:30 AM WH-MFC US 1 WH-MFCUS MFC-US  09/15/2017 10:30 AM WH-MFC US 1 WH-MFCUS MFC-US  10/13/2017 10:30 AM WH-MFC US 1 WH-MFCUS MFC-US    Willodean Rosenthalarolyn Harraway-Smith, MD

## 2017-08-04 NOTE — Addendum Note (Signed)
Addended by: Mikey BussingWILSON, Tinzley Dalia L on: 08/04/2017 09:38 AM   Modules accepted: Orders

## 2017-08-04 NOTE — Patient Instructions (Signed)

## 2017-08-06 LAB — DRUG SCREEN, URINE
Amphetamines, Urine: NEGATIVE ng/mL
Barbiturate screen, urine: NEGATIVE ng/mL
Benzodiazepine Quant, Ur: NEGATIVE ng/mL
COCAINE (METAB.): NEGATIVE ng/mL
Cannabinoid Quant, Ur: NEGATIVE ng/mL
OPIATE QUANT UR: NEGATIVE ng/mL
PCP QUANT UR: NEGATIVE ng/mL

## 2017-08-06 LAB — SPECIMEN STATUS REPORT

## 2017-08-18 ENCOUNTER — Ambulatory Visit (HOSPITAL_COMMUNITY)
Admission: RE | Admit: 2017-08-18 | Discharge: 2017-08-18 | Disposition: A | Discharge status: Home / Self Care | Payer: Medicaid Other | Source: Ambulatory Visit | Attending: Family Medicine | Admitting: Family Medicine

## 2017-08-18 ENCOUNTER — Encounter (HOSPITAL_COMMUNITY): Payer: Self-pay

## 2017-08-18 DIAGNOSIS — Z3A27 27 weeks gestation of pregnancy: Secondary | ICD-10-CM | POA: Diagnosis not present

## 2017-08-18 DIAGNOSIS — O30042 Twin pregnancy, dichorionic/diamniotic, second trimester: Secondary | ICD-10-CM | POA: Diagnosis not present

## 2017-08-18 DIAGNOSIS — O30049 Twin pregnancy, dichorionic/diamniotic, unspecified trimester: Secondary | ICD-10-CM | POA: Diagnosis not present

## 2017-08-18 DIAGNOSIS — O09522 Supervision of elderly multigravida, second trimester: Secondary | ICD-10-CM

## 2017-08-19 ENCOUNTER — Other Ambulatory Visit (HOSPITAL_COMMUNITY): Payer: Self-pay | Admitting: *Deleted

## 2017-08-19 DIAGNOSIS — O30049 Twin pregnancy, dichorionic/diamniotic, unspecified trimester: Secondary | ICD-10-CM

## 2017-09-01 ENCOUNTER — Ambulatory Visit (INDEPENDENT_AMBULATORY_CARE_PROVIDER_SITE_OTHER): Payer: Medicaid Other | Admitting: Obstetrics & Gynecology

## 2017-09-01 ENCOUNTER — Encounter: Payer: Self-pay | Admitting: Obstetrics & Gynecology

## 2017-09-01 VITALS — BP 102/64 | HR 105 | Wt 169.0 lb

## 2017-09-01 DIAGNOSIS — Z23 Encounter for immunization: Secondary | ICD-10-CM | POA: Diagnosis not present

## 2017-09-01 DIAGNOSIS — O0993 Supervision of high risk pregnancy, unspecified, third trimester: Secondary | ICD-10-CM

## 2017-09-01 DIAGNOSIS — O0990 Supervision of high risk pregnancy, unspecified, unspecified trimester: Secondary | ICD-10-CM

## 2017-09-01 DIAGNOSIS — O09523 Supervision of elderly multigravida, third trimester: Secondary | ICD-10-CM

## 2017-09-01 DIAGNOSIS — O99323 Drug use complicating pregnancy, third trimester: Secondary | ICD-10-CM

## 2017-09-01 DIAGNOSIS — O30043 Twin pregnancy, dichorionic/diamniotic, third trimester: Secondary | ICD-10-CM

## 2017-09-01 NOTE — Progress Notes (Signed)
   PRENATAL VISIT NOTE  Subjective:  Maria Chapman is a 37 y.o. [phone] at 5047w1d being seen today for ongoing prenatal care.  She is currently monitored for the following issues for this high-risk pregnancy and has Supervision of high risk pregnancy, antepartum; AMA (advanced maternal age) multigravida 35+; Dichorionic diamniotic twin pregnancy; and Drug use affecting pregnancy on their problem list.  Patient reports no complaints. Denies leaking of fluid, VB or CTX   The following portions of the patient's history were reviewed and updated as appropriate: allergies, current medications, past family history, past medical history, past social history, past surgical history and problem list. Problem list updated.  Objective:  BP 102/64   Pulse (!) 105   Wt 169 lb (76.7 kg)   LMP 02/09/2017   BMI 28.12 kg/m   Fetal Status:           General:  Alert, oriented and cooperative. Patient is in no acute distress.  Skin: Skin is warm and dry. No rash noted.   Cardiovascular: Normal heart rate noted  Respiratory: Normal respiratory effort, no problems with respiration noted  Abdomen: Soft, gravid, appropriate for gestational age.        Pelvic: Cervical exam deferred        Extremities: Normal range of motion.     Mental Status: Normal mood and affect. Normal behavior. Normal judgment and thought content.   Assessment and Plan:  Pregnancy: [id number] at 7147w1d  1. Supervision of high risk pregnancy, antepartum Tdap today 2 hour GTT  2. Drug use affecting pregnancy in third trimester No active drug use. Tox neg  3. Dichorionic diamniotic twin pregnancy in third trimester 08/18/2017 Dichorionic-diamniotic twin pregnancy,  Twin A: Female fetus. Anterior placenta. Breech. Fetal growth is  appropriate for gestational age. Amniotic fluid is normal and  good fetal activity is seen. Pyelectases have resolved.  Twin B: Female fetus. Anterior placenta. Breech. Fetal growth  is appropriate for  gestational age. Amniotic fluid is normal  and good fetal activity is seen.  Growth discordancy: 14% (normal).  We reassured the couple of the findings. ---------------------------------------------------------------------- Recommendations  An appointment was made for her to return in 4 weeks for  fetal growth assessment.  4. Multigravida of advanced maternal age in third trimester  Preterm labor symptoms and general obstetric precautions including but not limited to vaginal bleeding, contractions, leaking of fluid and fetal movement were reviewed in detail with the patient. Please refer to After Visit Summary for other counseling recommendations.  Return in about 2 weeks (around 09/15/2017).  Future Appointments  Date Time Provider Department Center  09/15/2017 10:30 AM [facility] US 1 [facility]  10/13/2017 10:30 AM [facility] US 1 [facility]    Willodean Rosenthalarolyn Harraway-Smith, MD

## 2017-09-01 NOTE — Addendum Note (Signed)
Addended by: Lorelle GibbsWILSON, Demon Volante L on: 09/01/2017 11:14 AM   Modules accepted: Orders

## 2017-09-01 NOTE — Progress Notes (Signed)
Patient will come later  this week to complete her 28 week labs as she is not fasting today. Armandina StammerJennifer Howard RN

## 2017-09-03 ENCOUNTER — Other Ambulatory Visit: Payer: Medicaid Other

## 2017-09-03 DIAGNOSIS — O0990 Supervision of high risk pregnancy, unspecified, unspecified trimester: Secondary | ICD-10-CM

## 2017-09-03 LAB — OB RESULTS CONSOLE HIV ANTIBODY (ROUTINE TESTING): HIV: NONREACTIVE

## 2017-09-03 NOTE — Progress Notes (Signed)
Pt presents to the office for her 28 week labs.

## 2017-09-04 LAB — CBC
Hematocrit: 33.2 % — ABNORMAL LOW (ref 34.0–46.6)
Hemoglobin: 10.3 g/dL — ABNORMAL LOW (ref 11.1–15.9)
MCH: 28.7 pg (ref 26.6–33.0)
MCHC: 31 g/dL — AB (ref 31.5–35.7)
MCV: 93 fL (ref 79–97)
PLATELETS: 197 10*3/uL (ref 150–450)
RBC: 3.59 x10E6/uL — ABNORMAL LOW (ref 3.77–5.28)
RDW: 13.3 % (ref 12.3–15.4)
WBC: 8.5 10*3/uL (ref 3.4–10.8)

## 2017-09-04 LAB — RPR: RPR: NONREACTIVE

## 2017-09-04 LAB — HIV ANTIBODY (ROUTINE TESTING W REFLEX): HIV SCREEN 4TH GENERATION: NONREACTIVE

## 2017-09-04 LAB — GLUCOSE TOLERANCE, 2 HOURS W/ 1HR
Glucose, 1 hour: 121 mg/dL (ref 65–179)
Glucose, 2 hour: 61 mg/dL — ABNORMAL LOW (ref 65–152)
Glucose, Fasting: 73 mg/dL (ref 65–91)

## 2017-09-15 ENCOUNTER — Encounter (HOSPITAL_COMMUNITY): Payer: Self-pay

## 2017-09-15 ENCOUNTER — Other Ambulatory Visit (HOSPITAL_COMMUNITY): Payer: Self-pay | Admitting: Obstetrics and Gynecology

## 2017-09-15 ENCOUNTER — Ambulatory Visit (HOSPITAL_COMMUNITY)
Admission: RE | Admit: 2017-09-15 | Discharge: 2017-09-15 | Disposition: A | Discharge status: Home / Self Care | Payer: Medicaid Other | Source: Ambulatory Visit | Attending: Family Medicine | Admitting: Family Medicine

## 2017-09-15 DIAGNOSIS — O30043 Twin pregnancy, dichorionic/diamniotic, third trimester: Secondary | ICD-10-CM | POA: Diagnosis not present

## 2017-09-15 DIAGNOSIS — O99323 Drug use complicating pregnancy, third trimester: Secondary | ICD-10-CM | POA: Diagnosis not present

## 2017-09-15 DIAGNOSIS — O30049 Twin pregnancy, dichorionic/diamniotic, unspecified trimester: Secondary | ICD-10-CM

## 2017-09-15 DIAGNOSIS — O10913 Unspecified pre-existing hypertension complicating pregnancy, third trimester: Secondary | ICD-10-CM

## 2017-09-15 DIAGNOSIS — O09523 Supervision of elderly multigravida, third trimester: Secondary | ICD-10-CM

## 2017-09-15 DIAGNOSIS — F1210 Cannabis abuse, uncomplicated: Secondary | ICD-10-CM | POA: Insufficient documentation

## 2017-09-15 DIAGNOSIS — Z3A31 31 weeks gestation of pregnancy: Secondary | ICD-10-CM | POA: Diagnosis not present

## 2017-09-18 ENCOUNTER — Ambulatory Visit (INDEPENDENT_AMBULATORY_CARE_PROVIDER_SITE_OTHER): Payer: Medicaid Other | Admitting: Family Medicine

## 2017-09-18 VITALS — BP 105/69 | HR 98 | Wt 173.0 lb

## 2017-09-18 DIAGNOSIS — O0990 Supervision of high risk pregnancy, unspecified, unspecified trimester: Secondary | ICD-10-CM

## 2017-09-18 DIAGNOSIS — O09523 Supervision of elderly multigravida, third trimester: Secondary | ICD-10-CM

## 2017-09-18 DIAGNOSIS — O99322 Drug use complicating pregnancy, second trimester: Secondary | ICD-10-CM

## 2017-09-18 DIAGNOSIS — O30043 Twin pregnancy, dichorionic/diamniotic, third trimester: Secondary | ICD-10-CM

## 2017-09-18 LAB — POCT URINALYSIS DIPSTICK OB
GLUCOSE, UA: NEGATIVE — AB
POC,PROTEIN,UA: NEGATIVE

## 2017-09-18 NOTE — Progress Notes (Signed)
   PRENATAL VISIT NOTE  Subjective:  Maria Chapman is a 37 y.o. [phone] at 7478w4d being seen today for ongoing prenatal care.  She is currently monitored for the following issues for this high-risk pregnancy and has Supervision of high risk pregnancy, antepartum; AMA (advanced maternal age) multigravida 35+; Dichorionic diamniotic twin pregnancy; and Drug use affecting pregnancy on their problem list.  Patient reports no complaints.  Contractions: Not present. Vag. Bleeding: None.  Movement: Present. Denies leaking of fluid.   The following portions of the patient's history were reviewed and updated as appropriate: allergies, current medications, past family history, past medical history, past social history, past surgical history and problem list. Problem list updated.  Objective:   Vitals:   09/18/17 0912  BP: 105/69  Pulse: 98  Weight: 173 lb (78.5 kg)    Fetal Status: Fetal Heart Rate (bpm): 145/140   Movement: Present     General:  Alert, oriented and cooperative. Patient is in no acute distress.  Skin: Skin is warm and dry. No rash noted.   Cardiovascular: Normal heart rate noted  Respiratory: Normal respiratory effort, no problems with respiration noted  Abdomen: Soft, gravid, appropriate for gestational age.  Pain/Pressure: Absent     Pelvic: Cervical exam deferred        Extremities: Normal range of motion.  Edema: None  Mental Status: Normal mood and affect. Normal behavior. Normal judgment and thought content.   Assessment and Plan:  Pregnancy: [id number] at 578w4d  1. Supervision of high risk pregnancy, antepartum FHT and FH normal.  2. Dichorionic diamniotic twin pregnancy in third trimester 0% discordancy Weight 1940g, 76% Antenatal testing at 35 weeks Discussed dietary recommendations for twins.  3. Multigravida of advanced maternal age in third trimester  4. Drug use affecting pregnancy in second trimester Subsequent UDS normal  Preterm labor symptoms and  general obstetric precautions including but not limited to vaginal bleeding, contractions, leaking of fluid and fetal movement were reviewed in detail with the patient. Please refer to After Visit Summary for other counseling recommendations.  Return in about 2 weeks (around 10/02/2017).  Future Appointments  Date Time Provider Department Center  10/03/2017 10:00 AM Willodean RosenthalHarraway-Smith, Carolyn, MD [facility] None  10/13/2017 10:30 AM [facility] US 1 [facility]    Levie HeritageJacob J Stinson, DO

## 2017-09-18 NOTE — Patient Instructions (Signed)
Twin Dietary Recommendations:  . Eat every 3-4 hours: at least 3 meals plus 2-3 snacks per day . Iron supplement twice a day and lean protein: fish, poultry, dairy, nuts . Complex carbohydrates: fruits, legumes, and whole grains . Daily mineral supplements: Calcium 3g, Magnesium 1.2g, Zinc 45mg . Healthy fats: mono and poly unsaturated (olive oil, canola oil, nuts, seeds). Limit saturated fats and trans fats . Omega-3 fatty acid rich fish protein 2-4x per week . BMI specific dietary recommendations for calories and protein   Dietary Recommendations in Pregnancy:      Twins:      Non-pregnant Single Baby  Underweight (BMI < 19.8) Normal Weight (BMI 20 -26) Overweight (BMI 26-29) Obese (BMI >29)  (BMI >29)         Calories (Kcal) 2200 2500  4000 3500 3250 3000  Protein (g) 110 126  200 175 163 150  Carbohydrate (g) 220 248  400 350 325 300  Fat (g) 98 112  178 156 144 133       

## 2017-09-18 NOTE — Addendum Note (Signed)
Addended by: Anell BarrHOWARD, Sundiata Ferrick L on: 09/18/2017 10:59 AM   Modules accepted: Orders

## 2017-10-03 ENCOUNTER — Ambulatory Visit (INDEPENDENT_AMBULATORY_CARE_PROVIDER_SITE_OTHER): Payer: Medicaid Other | Admitting: Obstetrics & Gynecology

## 2017-10-03 VITALS — BP 96/61 | HR 97 | Wt 174.0 lb

## 2017-10-03 DIAGNOSIS — O0990 Supervision of high risk pregnancy, unspecified, unspecified trimester: Secondary | ICD-10-CM

## 2017-10-03 DIAGNOSIS — O09523 Supervision of elderly multigravida, third trimester: Secondary | ICD-10-CM

## 2017-10-03 DIAGNOSIS — O99323 Drug use complicating pregnancy, third trimester: Secondary | ICD-10-CM

## 2017-10-03 DIAGNOSIS — O30043 Twin pregnancy, dichorionic/diamniotic, third trimester: Secondary | ICD-10-CM

## 2017-10-03 NOTE — Progress Notes (Signed)
   PRENATAL VISIT NOTE  Subjective:  Maria Chapman is a 37 y.o. [phone] at 7634w5d being seen today for ongoing prenatal care.  She is currently monitored for the following issues for this high-risk pregnancy and has Supervision of high risk pregnancy, antepartum; AMA (advanced maternal age) multigravida 35+; and Dichorionic diamniotic twin pregnancy on their problem list.  Patient reports occasional contractions.  Contractions: Irritability. Vag. Bleeding: Scant.  Movement: Present. Denies leaking of fluid.   The following portions of the patient's history were reviewed and updated as appropriate: allergies, current medications, past family history, past medical history, past social history, past surgical history and problem list. Problem list updated.  Objective:   Vitals:   10/03/17 1008  BP: 96/61  Pulse: 97  Weight: 174 lb (78.9 kg)    Fetal Status: Fetal Heart Rate (bpm): 136/140   Movement: Present     General:  Alert, oriented and cooperative. Patient is in no acute distress.  Skin: Skin is warm and dry. No rash noted.   Cardiovascular: Normal heart rate noted  Respiratory: Normal respiratory effort, no problems with respiration noted  Abdomen: Soft, gravid, appropriate for gestational age.  Pain/Pressure: Present     Pelvic: Cervical exam deferred        Extremities: Normal range of motion.  Edema: None  Mental Status: Normal mood and affect. Normal behavior. Normal judgment and thought content.   Assessment and Plan:  Pregnancy: [id number] at 6134w5d  1. Supervision of high risk pregnancy, antepartum  2. Drug use affecting pregnancy in third trimester resolved  3. Dichorionic diamniotic twin pregnancy in third trimester 09/15/2017 US No growth discordancy is seen.  4. Multigravida of advanced maternal age in third trimester  Preterm labor symptoms and general obstetric precautions including but not limited to vaginal bleeding, contractions, leaking of fluid and fetal  movement were reviewed in detail with the patient. Please refer to After Visit Summary for other counseling recommendations.  Return in about 2 weeks (around 10/17/2017).  Future Appointments  Date Time Provider Department Center  10/13/2017 10:30 AM [facility] US 1 [facility]  10/14/2017  8:30 AM Aviva SignsWilliams, Marie L, CNM [facility] None    Maria Rosenthalarolyn Harraway-Smith, MD

## 2017-10-13 ENCOUNTER — Other Ambulatory Visit (HOSPITAL_COMMUNITY): Payer: Self-pay | Admitting: *Deleted

## 2017-10-13 ENCOUNTER — Encounter (HOSPITAL_COMMUNITY): Payer: Self-pay

## 2017-10-13 ENCOUNTER — Ambulatory Visit (HOSPITAL_COMMUNITY)
Admission: RE | Admit: 2017-10-13 | Discharge: 2017-10-13 | Disposition: A | Discharge status: Home / Self Care | Payer: Medicaid Other | Source: Ambulatory Visit | Attending: Family Medicine | Admitting: Family Medicine

## 2017-10-13 DIAGNOSIS — Z362 Encounter for other antenatal screening follow-up: Secondary | ICD-10-CM | POA: Diagnosis not present

## 2017-10-13 DIAGNOSIS — O30049 Twin pregnancy, dichorionic/diamniotic, unspecified trimester: Secondary | ICD-10-CM

## 2017-10-13 DIAGNOSIS — Z3A35 35 weeks gestation of pregnancy: Secondary | ICD-10-CM | POA: Diagnosis not present

## 2017-10-13 DIAGNOSIS — O30043 Twin pregnancy, dichorionic/diamniotic, third trimester: Secondary | ICD-10-CM | POA: Diagnosis not present

## 2017-10-13 DIAGNOSIS — O09523 Supervision of elderly multigravida, third trimester: Secondary | ICD-10-CM

## 2017-10-14 ENCOUNTER — Other Ambulatory Visit (HOSPITAL_COMMUNITY)
Admission: RE | Admit: 2017-10-14 | Discharge: 2017-10-14 | Disposition: A | Discharge status: Home / Self Care | Payer: Medicaid Other | Source: Ambulatory Visit | Attending: Advanced Practice Midwife | Admitting: Advanced Practice Midwife

## 2017-10-14 ENCOUNTER — Ambulatory Visit (INDEPENDENT_AMBULATORY_CARE_PROVIDER_SITE_OTHER): Payer: Medicaid Other | Admitting: Advanced Practice Midwife

## 2017-10-14 ENCOUNTER — Encounter: Payer: Self-pay | Admitting: Advanced Practice Midwife

## 2017-10-14 VITALS — BP 109/68 | HR 102 | Wt 175.0 lb

## 2017-10-14 DIAGNOSIS — O09523 Supervision of elderly multigravida, third trimester: Secondary | ICD-10-CM

## 2017-10-14 DIAGNOSIS — Z3493 Encounter for supervision of normal pregnancy, unspecified, third trimester: Secondary | ICD-10-CM

## 2017-10-14 DIAGNOSIS — Z3490 Encounter for supervision of normal pregnancy, unspecified, unspecified trimester: Secondary | ICD-10-CM | POA: Diagnosis present

## 2017-10-14 DIAGNOSIS — O30043 Twin pregnancy, dichorionic/diamniotic, third trimester: Secondary | ICD-10-CM

## 2017-10-14 LAB — OB RESULTS CONSOLE GBS: GBS: NEGATIVE

## 2017-10-14 LAB — OB RESULTS CONSOLE GC/CHLAMYDIA: Gonorrhea: NEGATIVE

## 2017-10-14 NOTE — Patient Instructions (Signed)
Third Trimester of Pregnancy The third trimester is from week 29 through week 42, months 7 through 9. This trimester is when your unborn baby (fetus) is growing very fast. At the end of the ninth month, the unborn baby is about 20 inches in length. It weighs about 6-10 pounds. Follow these instructions at home:  Avoid all smoking, herbs, and alcohol. Avoid drugs not approved by your doctor.  Do not use any tobacco products, including cigarettes, chewing tobacco, and electronic cigarettes. If you need help quitting, ask your doctor. You may get counseling or other support to help you quit.  Only take medicine as told by your doctor. Some medicines are safe and some are not during pregnancy.  Exercise only as told by your doctor. Stop exercising if you start having cramps.  Eat regular, healthy meals.  Wear a good support bra if your breasts are tender.  Do not use hot tubs, steam rooms, or saunas.  Wear your seat belt when driving.  Avoid raw meat, uncooked cheese, and liter boxes and soil used by cats.  Take your prenatal vitamins.  Take 1500-2000 milligrams of calcium daily starting at the 20th week of pregnancy until you deliver your baby.  Try taking medicine that helps you poop (stool softener) as needed, and if your doctor approves. Eat more fiber by eating fresh fruit, vegetables, and whole grains. Drink enough fluids to keep your pee (urine) clear or pale yellow.  Take warm water baths (sitz baths) to soothe pain or discomfort caused by hemorrhoids. Use hemorrhoid cream if your doctor approves.  If you have puffy, bulging veins (varicose veins), wear support hose. Raise (elevate) your feet for 15 minutes, 3-4 times a day. Limit salt in your diet.  Avoid heavy lifting, wear low heels, and sit up straight.  Rest with your legs raised if you have leg cramps or low back pain.  Visit your dentist if you have not gone during your pregnancy. Use a soft toothbrush to brush your  teeth. Be gentle when you floss.  You can have sex (intercourse) unless your doctor tells you not to.  Do not travel far distances unless you must. Only do so with your doctor's approval.  Take prenatal classes.  Practice driving to the hospital.  Pack your hospital bag.  Prepare the baby's room.  Go to your doctor visits. Get help if:  You are not sure if you are in labor or if your water has broken.  You are dizzy.  You have mild cramps or pressure in your lower belly (abdominal).  You have a nagging pain in your belly area.  You continue to feel sick to your stomach (nauseous), throw up (vomit), or have watery poop (diarrhea).  You have bad smelling fluid coming from your vagina.  You have pain with peeing (urination). Get help right away if:  You have a fever.  You are leaking fluid from your vagina.  You are spotting or bleeding from your vagina.  You have severe belly cramping or pain.  You lose or gain weight rapidly.  You have trouble catching your breath and have chest pain.  You notice sudden or extreme puffiness (swelling) of your face, hands, ankles, feet, or legs.  You have not felt the baby move in over an hour.  You have severe headaches that do not go away with medicine.  You have vision changes. This information is not intended to replace advice given to you by your health care provider. Make   sure you discuss any questions you have with your health care provider. Document Released: 04/17/2009 Document Revised: 06/29/2015 Document Reviewed: 03/24/2012 Elsevier Interactive Patient Education  2017 [employer] Group B Streptococcus Infection During Pregnancy Group B Streptococcus (GBS) is a type of bacteria (Streptococcus agalactiae) that is often found in healthy people, commonly in the rectum, vagina, and intestines. In people who are healthy and not pregnant, the bacteria rarely cause serious illness or complications. However, women who test  positive for GBS during pregnancy can pass the bacteria to their baby during childbirth, which can cause serious infection in the baby after birth. Women with GBS may also have infections during their pregnancy or immediately after childbirth, such as such as urinary tract infections (UTIs) or infections of the uterus (uterine infections). Having GBS also increases a woman's risk of complications during pregnancy, such as early (preterm) labor or delivery, miscarriage, or stillbirth. Routine testing (screening) for GBS is recommended for all pregnant women. What increases the risk? You may have a higher risk for GBS infection during pregnancy if you had one during a past pregnancy. What are the signs or symptoms? In most cases, GBS infection does not cause symptoms in pregnant women. Signs and symptoms of a possible GBS-related infection may include:  Labor starting before the 37th week of pregnancy.  A UTI or bladder infection, which may cause: ? Fever. ? Pain or burning during urination. ? Frequent urination.  Fever during labor, along with: ? Bad-smelling discharge. ? Uterine tenderness. ? Rapid heartbeat in the mother, baby, or both.  Rare but serious symptoms of a possible GBS-related infection in women include:  Blood infection (septicemia). This may cause fever, chills, or confusion.  Lung infection (pneumonia). This may cause fever, chills, cough, rapid breathing, difficulty breathing, or chest pain.  Bone, joint, skin, or soft tissue infection.  How is this diagnosed? You may be screened for GBS between week 35 and week 37 of your pregnancy. If you have symptoms of preterm labor, you may be screened earlier. This condition is diagnosed based on lab test results from:  A swab of fluid from the vagina and rectum.  A urine sample.  How is this treated? This condition is treated with antibiotic medicine. When you go into labor, or as soon as your water breaks (your membranes  rupture), you will be given antibiotics through an IV tube. Antibiotics will continue until after you give birth. If you are having a cesarean delivery, you do not need antibiotics unless your membranes have already ruptured. Follow these instructions at home:  Take over-the-counter and prescription medicines only as told by your health care provider.  Take your antibiotic medicine as told by your health care provider. Do not stop taking the antibiotic even if you start to feel better.  Keep all pre-birth (prenatal) visits and follow-up visits as told by your health care provider. This is important. Contact a health care provider if:  You have pain or burning when you urinate.  You have to urinate frequently.  You have a fever or chills.  You develop a bad-smelling vaginal discharge. Get help right away if:  Your membranes rupture.  You go into labor.  You have severe pain in your abdomen.  You have difficulty breathing.  You have chest pain. This information is not intended to replace advice given to you by your health care provider. Make sure you discuss any questions you have with your health care provider. Document Released: 04/30/2007 Document Revised: 08/18/2015   Document Reviewed: 08/17/2015 Elsevier Interactive Patient Education  2018 [employer] Inc[employer]  

## 2017-10-15 LAB — CERVICOVAGINAL ANCILLARY ONLY
CHLAMYDIA, DNA PROBE: NEGATIVE
NEISSERIA GONORRHEA: NEGATIVE

## 2017-10-15 NOTE — Progress Notes (Signed)
   PRENATAL VISIT NOTE  Subjective:  Maria Chapman is a 37 y.o. [phone] at [redacted]w[redacted]d being seen today for ongoing prenatal care.  She is currently monitored for the following issues for this high-risk pregnancy and has Pregnancy; AMA (advanced maternal age) multigravida 35+; and Dichorionic diamniotic twin pregnancy on their problem list.  Patient reports occasional contractions.  Contractions: Irritability. Vag. Bleeding: None.  Movement: Present. Denies leaking of fluid.   The following portions of the patient's history were reviewed and updated as appropriate: allergies, current medications, past family history, past medical history, past social history, past surgical history and problem list. Problem list updated.  Objective:   Vitals:   10/14/17 0836  BP: 109/68  Pulse: (!) 102  Weight: 79.4 kg    Fetal Status: Fetal Heart Rate (bpm): 152/150 Fundal Height: 44 cm Movement: Present     General:  Alert, oriented and cooperative. Patient is in no acute distress.  Skin: Skin is warm and dry. No rash noted.   Cardiovascular: Normal heart rate noted  Respiratory: Normal respiratory effort, no problems with respiration noted  Abdomen: Soft, gravid, appropriate for gestational age.  Pain/Pressure: Present     Pelvic: Cervical exam performed      closed/50/-3/vtx  Extremities: Normal range of motion.  Edema: None  Mental Status: Normal mood and affect. Normal behavior. Normal judgment and thought content.   Assessment and Plan:  Pregnancy: [id number] at [redacted]w[redacted]d  1. Prenatal care, antepartum      Went ahead and did early GBS since she has twins - Culture, beta strep (group b only) - Cervicovaginal ancillary only  2. Dichorionic diamniotic twin pregnancy in third trimester     Reviewed US findings from yesterday.  Presentation is vertex/vertex.  5% discrepancy  3. Multigravida of advanced maternal age in third trimester     Labor precautions  Preterm labor symptoms and general  obstetric precautions including but not limited to vaginal bleeding, contractions, leaking of fluid and fetal movement were reviewed in detail with the patient. Please refer to After Visit Summary for other counseling recommendations.  Return in about 1 week (around 10/21/2017) for [facility].  Future Appointments  Date Time Provider Department Center  10/20/2017 10:45 AM [facility] [place] 2 [facility]  10/21/2017 10:45 AM Rolm Bookbinder, CNM [facility] None  10/27/2017 10:45 AM [facility] [place] 2 [facility]    Wynelle Bourgeois, CNM

## 2017-10-15 NOTE — Progress Notes (Deleted)
   PRENATAL VISIT NOTE  Subjective:  Maria Chapman is a 37 y.o. [phone] at [redacted]w[redacted]d being seen today for ongoing prenatal care.  She is currently monitored for the following issues for this {Blank single:19197::"high-risk","low-risk"} pregnancy and has Pregnancy; AMA (advanced maternal age) multigravida 35+; and Dichorionic diamniotic twin pregnancy on their problem list.  Patient reports {sx:[id number]}.  Contractions: Irritability. Vag. Bleeding: None.  Movement: Present. Denies leaking of fluid.   The following portions of the patient's history were reviewed and updated as appropriate: allergies, current medications, past family history, past medical history, past social history, past surgical history and problem list. Problem list updated.  Objective:   Vitals:   10/14/17 0836  BP: 109/68  Pulse: (!) 102  Weight: 79.4 kg    Fetal Status: Fetal Heart Rate (bpm): 152/150 Fundal Height: 44 cm Movement: Present     General:  Alert, oriented and cooperative. Patient is in no acute distress.  Skin: Skin is warm and dry. No rash noted.   Cardiovascular: Normal heart rate noted  Respiratory: Normal respiratory effort, no problems with respiration noted  Abdomen: Soft, gravid, appropriate for gestational age.  Pain/Pressure: Present     Pelvic: {Blank single:19197::"Cervical exam performed","Cervical exam deferred"}        Extremities: Normal range of motion.  Edema: None  Mental Status: Normal mood and affect. Normal behavior. Normal judgment and thought content.   Assessment and Plan:  Pregnancy: [id number] at [redacted]w[redacted]d  1. Prenatal care, antepartum *** - Culture, beta strep (group b only) - Cervicovaginal ancillary only  2. Dichorionic diamniotic twin pregnancy in third trimester ***  3. Multigravida of advanced maternal age in third trimester ***  {Blank single:19197::"Term","Preterm"} labor symptoms and general obstetric precautions including but not limited to vaginal bleeding,  contractions, leaking of fluid and fetal movement were reviewed in detail with the patient. Please refer to After Visit Summary for other counseling recommendations.  Return in about 1 week (around 10/21/2017) for [facility].  Future Appointments  Date Time Provider Department Center  10/20/2017 10:45 AM [facility] [place] 2 [facility]  10/21/2017 10:45 AM Rolm Bookbinder, CNM [facility] None  10/27/2017 10:45 AM [facility] [place] 2 [facility]    Wynelle Bourgeois, CNM

## 2017-10-17 ENCOUNTER — Telehealth: Payer: Self-pay

## 2017-10-17 NOTE — Telephone Encounter (Signed)
Pt called the office for GC/Chlamydia and GBS results. Informed pt that GC/Chlamydia is negative and that GBS has not resulted.Understanding was voiced.

## 2017-10-18 LAB — CULTURE, BETA STREP (GROUP B ONLY): Strep Gp B Culture: NEGATIVE

## 2017-10-20 ENCOUNTER — Encounter (HOSPITAL_COMMUNITY): Payer: Self-pay

## 2017-10-20 ENCOUNTER — Ambulatory Visit (HOSPITAL_COMMUNITY)
Admission: RE | Admit: 2017-10-20 | Discharge: 2017-10-20 | Disposition: A | Discharge status: Home / Self Care | Payer: Medicaid Other | Source: Ambulatory Visit | Attending: Family Medicine | Admitting: Family Medicine

## 2017-10-20 DIAGNOSIS — O09523 Supervision of elderly multigravida, third trimester: Secondary | ICD-10-CM | POA: Diagnosis not present

## 2017-10-20 DIAGNOSIS — O30043 Twin pregnancy, dichorionic/diamniotic, third trimester: Secondary | ICD-10-CM | POA: Diagnosis present

## 2017-10-20 DIAGNOSIS — Z3A36 36 weeks gestation of pregnancy: Secondary | ICD-10-CM | POA: Insufficient documentation

## 2017-10-20 DIAGNOSIS — O321XX0 Maternal care for breech presentation, not applicable or unspecified: Secondary | ICD-10-CM | POA: Insufficient documentation

## 2017-10-21 ENCOUNTER — Ambulatory Visit (INDEPENDENT_AMBULATORY_CARE_PROVIDER_SITE_OTHER): Payer: Medicaid Other

## 2017-10-21 VITALS — BP 113/77 | HR 80 | Wt 181.0 lb

## 2017-10-21 DIAGNOSIS — O0993 Supervision of high risk pregnancy, unspecified, third trimester: Secondary | ICD-10-CM

## 2017-10-21 DIAGNOSIS — O30043 Twin pregnancy, dichorionic/diamniotic, third trimester: Secondary | ICD-10-CM

## 2017-10-21 DIAGNOSIS — O0990 Supervision of high risk pregnancy, unspecified, unspecified trimester: Secondary | ICD-10-CM

## 2017-10-21 LAB — POCT URINALYSIS DIPSTICK OB
GLUCOSE, UA: NEGATIVE
POC,PROTEIN,UA: NEGATIVE

## 2017-10-21 NOTE — Progress Notes (Signed)
   PRENATAL VISIT NOTE  Subjective:  Maria Chapman is a 37 y.o. [phone] at 5321w2d being seen today for ongoing prenatal care.  She is currently monitored for the following issues for this high-risk pregnancy and has Pregnancy; AMA (advanced maternal age) multigravida 35+; and Dichorionic diamniotic twin pregnancy on their problem list.  Patient reports no complaints.  Contractions: Irregular. Vag. Bleeding: None.  Movement: Present. Denies leaking of fluid.   The following portions of the patient's history were reviewed and updated as appropriate: allergies, current medications, past family history, past medical history, past social history, past surgical history and problem list. Problem list updated.  Objective:   Vitals:   10/21/17 1058  BP: 113/77  Pulse: 80  Weight: 181 lb (82.1 kg)    Fetal Status: Fetal Heart Rate (bpm): 138/142 Fundal Height: 45 cm Movement: Present     General:  Alert, oriented and cooperative. Patient is in no acute distress.  Skin: Skin is warm and dry. No rash noted.   Cardiovascular: Normal heart rate noted  Respiratory: Normal respiratory effort, no problems with respiration noted  Abdomen: Soft, gravid, appropriate for gestational age.  Pain/Pressure: Present     Pelvic: Cervical exam performed Dilation: 1 Effacement (%): 50 Station: -3  Extremities: Normal range of motion.  Edema: Mild pitting, slight indentation  Mental Status: Normal mood and affect. Normal behavior. Normal judgment and thought content.   Assessment and Plan:  Pregnancy: [id number] at 9021w2d  1. Supervision of high risk pregnancy, antepartum - No complaints. Routine care - POC Urinalysis Dipstick OB  2. Dichorionic diamniotic twin pregnancy in third trimester - BPP yesterday 8/8 for both babies - Parents were told baby B is breech but u/s report shows cephalic presentation for both babies. Limited bedside u/s today shows baby B breech. Message sent to MFM to review images and  presentation to clarify. Will reassess at u/s scheduled for Monday 9/23  Preterm labor symptoms and general obstetric precautions including but not limited to vaginal bleeding, contractions, leaking of fluid and fetal movement were reviewed in detail with the patient. Please refer to After Visit Summary for other counseling recommendations.  Return in about 1 week (around 10/28/2017) for Return OB visit.  Future Appointments  Date Time Provider Department Center  10/27/2017  8:15 AM Levie HeritageStinson, Jacob J, DO [facility] None  10/27/2017 10:45 AM [facility] US 2 [facility]    Rolm BookbinderCaroline M Neill, CNM  10/21/17 11:29 AM

## 2017-10-27 ENCOUNTER — Other Ambulatory Visit (HOSPITAL_COMMUNITY): Payer: Self-pay | Admitting: *Deleted

## 2017-10-27 ENCOUNTER — Ambulatory Visit (INDEPENDENT_AMBULATORY_CARE_PROVIDER_SITE_OTHER): Payer: Medicaid Other | Admitting: Family Medicine

## 2017-10-27 ENCOUNTER — Ambulatory Visit (HOSPITAL_BASED_OUTPATIENT_CLINIC_OR_DEPARTMENT_OTHER)
Admission: RE | Admit: 2017-10-27 | Discharge: 2017-10-27 | Disposition: A | Discharge status: Home / Self Care | Payer: Medicaid Other | Source: Ambulatory Visit | Attending: Family Medicine | Admitting: Family Medicine

## 2017-10-27 ENCOUNTER — Encounter (HOSPITAL_COMMUNITY): Payer: Self-pay

## 2017-10-27 VITALS — BP 117/83 | HR 88 | Wt 184.0 lb

## 2017-10-27 DIAGNOSIS — O30043 Twin pregnancy, dichorionic/diamniotic, third trimester: Secondary | ICD-10-CM | POA: Insufficient documentation

## 2017-10-27 DIAGNOSIS — O09523 Supervision of elderly multigravida, third trimester: Secondary | ICD-10-CM | POA: Insufficient documentation

## 2017-10-27 DIAGNOSIS — Z3A37 37 weeks gestation of pregnancy: Secondary | ICD-10-CM

## 2017-10-27 DIAGNOSIS — O321XX2 Maternal care for breech presentation, fetus 2: Secondary | ICD-10-CM | POA: Insufficient documentation

## 2017-10-27 DIAGNOSIS — O09A3 Supervision of pregnancy with history of molar pregnancy, third trimester: Secondary | ICD-10-CM | POA: Insufficient documentation

## 2017-10-27 DIAGNOSIS — O0990 Supervision of high risk pregnancy, unspecified, unspecified trimester: Secondary | ICD-10-CM

## 2017-10-27 NOTE — Progress Notes (Signed)
   PRENATAL VISIT NOTE  Subjective:  Maria Chapman is a 37 y.o. [phone] at 756w1d being seen today for ongoing prenatal care.  She is currently monitored for the following issues for this high-risk pregnancy and has Pregnancy; AMA (advanced maternal age) multigravida 35+; and Dichorionic diamniotic twin pregnancy on their problem list.  Patient reports occasional contractions and uncomfortable.  Contractions: Irregular. Vag. Bleeding: None.  Movement: Present. Denies leaking of fluid.   The following portions of the patient's history were reviewed and updated as appropriate: allergies, current medications, past family history, past medical history, past social history, past surgical history and problem list. Problem list updated.  Objective:   Vitals:   10/27/17 0805  BP: 117/83  Pulse: 88  Weight: 184 lb (83.5 kg)    Fetal Status: Fetal Heart Rate (bpm): 143/159   Movement: Present  Presentation: Vertex  General:  Alert, oriented and cooperative. Patient is in no acute distress.  Skin: Skin is warm and dry. No rash noted.   Cardiovascular: Normal heart rate noted  Respiratory: Normal respiratory effort, no problems with respiration noted  Abdomen: Soft, gravid, appropriate for gestational age.  Pain/Pressure: Present     Pelvic: Cervical exam performed Dilation: 3 Effacement (%): 80 Station: -3  Extremities: Normal range of motion.  Edema: Trace  Mental Status: Normal mood and affect. Normal behavior. Normal judgment and thought content.   Assessment and Plan:  Pregnancy: [id number] at 9556w1d  1. Supervision of high risk pregnancy, antepartum 2. Dichorionic diamniotic twin pregnancy in third trimester 3. Multigravida of advanced maternal age in third trimester   Term labor symptoms and general obstetric precautions including but not limited to vaginal bleeding, contractions, leaking of fluid and fetal movement were reviewed in detail with the patient. Please refer to After Visit  Summary for other counseling recommendations.  Return in about 4 weeks (around 11/24/2017) for Postpartum Exam.  Future Appointments  Date Time Provider Department Center  10/27/2017 10:45 AM [facility] US 2 [facility]  12/04/2017  8:15 AM Levie HeritageStinson, Jacob J, DO [facility] None    Levie HeritageJacob J Stinson, DO

## 2017-10-28 ENCOUNTER — Inpatient Hospital Stay (HOSPITAL_COMMUNITY): Payer: Medicaid Other | Admitting: Anesthesiology

## 2017-10-28 ENCOUNTER — Inpatient Hospital Stay (HOSPITAL_COMMUNITY)
Admission: AD | Admit: 2017-10-28 | Discharge: 2017-10-30 | DRG: 788 | Disposition: A | Discharge status: Home / Self Care | Payer: Medicaid Other | Attending: Family Medicine | Admitting: Family Medicine

## 2017-10-28 ENCOUNTER — Encounter (HOSPITAL_COMMUNITY)
Admission: AD | Disposition: A | Discharge status: Home / Self Care | Payer: Self-pay | Source: Home / Self Care | Attending: Family Medicine

## 2017-10-28 ENCOUNTER — Encounter (HOSPITAL_COMMUNITY): Payer: Self-pay

## 2017-10-28 ENCOUNTER — Inpatient Hospital Stay (HOSPITAL_COMMUNITY)
Admission: AD | Admit: 2017-10-28 | Discharge: 2017-10-28 | Disposition: A | Discharge status: Home / Self Care | Payer: Medicaid Other | Source: Home / Self Care | Attending: Obstetrics and Gynecology | Admitting: Obstetrics and Gynecology

## 2017-10-28 ENCOUNTER — Telehealth (HOSPITAL_COMMUNITY): Payer: Self-pay | Admitting: *Deleted

## 2017-10-28 DIAGNOSIS — Z880 Allergy status to penicillin: Secondary | ICD-10-CM

## 2017-10-28 DIAGNOSIS — Z3483 Encounter for supervision of other normal pregnancy, third trimester: Secondary | ICD-10-CM | POA: Diagnosis present

## 2017-10-28 DIAGNOSIS — O471 False labor at or after 37 completed weeks of gestation: Secondary | ICD-10-CM | POA: Insufficient documentation

## 2017-10-28 DIAGNOSIS — O322XX0 Maternal care for transverse and oblique lie, not applicable or unspecified: Secondary | ICD-10-CM | POA: Diagnosis present

## 2017-10-28 DIAGNOSIS — Z87891 Personal history of nicotine dependence: Secondary | ICD-10-CM | POA: Diagnosis not present

## 2017-10-28 DIAGNOSIS — O30003 Twin pregnancy, unspecified number of placenta and unspecified number of amniotic sacs, third trimester: Secondary | ICD-10-CM | POA: Diagnosis present

## 2017-10-28 DIAGNOSIS — Z3A37 37 weeks gestation of pregnancy: Secondary | ICD-10-CM

## 2017-10-28 DIAGNOSIS — O321XX2 Maternal care for breech presentation, fetus 2: Principal | ICD-10-CM | POA: Diagnosis present

## 2017-10-28 DIAGNOSIS — O30043 Twin pregnancy, dichorionic/diamniotic, third trimester: Secondary | ICD-10-CM

## 2017-10-28 DIAGNOSIS — O30049 Twin pregnancy, dichorionic/diamniotic, unspecified trimester: Secondary | ICD-10-CM

## 2017-10-28 LAB — CBC
HCT: 33 % — ABNORMAL LOW (ref 36.0–46.0)
Hemoglobin: 11.1 g/dL — ABNORMAL LOW (ref 12.0–15.0)
MCH: 29.3 pg (ref 26.0–34.0)
MCHC: 33.6 g/dL (ref 30.0–36.0)
MCV: 87.1 fL (ref 78.0–100.0)
PLATELETS: 177 10*3/uL (ref 150–400)
RBC: 3.79 MIL/uL — ABNORMAL LOW (ref 3.87–5.11)
RDW: 12.9 % (ref 11.5–15.5)
WBC: 5.5 10*3/uL (ref 4.0–10.5)

## 2017-10-28 LAB — TYPE AND SCREEN
ABO/RH(D): B POS
Antibody Screen: NEGATIVE

## 2017-10-28 SURGERY — Surgical Case
Anesthesia: Spinal

## 2017-10-28 MED ORDER — DEXAMETHASONE SODIUM PHOSPHATE 10 MG/ML IJ SOLN
INTRAMUSCULAR | Status: DC | PRN
  Administered 2017-10-28: 10 mg via INTRAVENOUS

## 2017-10-28 MED ORDER — PHENYLEPHRINE HCL 10 MG/ML IJ SOLN
INTRAMUSCULAR | Status: DC | PRN
  Administered 2017-10-28: 120 ug via INTRAVENOUS
  Administered 2017-10-28 (×2): 80 ug via INTRAVENOUS

## 2017-10-28 MED ORDER — MORPHINE SULFATE (PF) 0.5 MG/ML IJ SOLN
INTRAMUSCULAR | Status: AC
  Filled 2017-10-28: qty 10

## 2017-10-28 MED ORDER — SODIUM CHLORIDE 0.9 % IV SOLN
500.0000 mg | Freq: Once | INTRAVENOUS | Status: AC
  Administered 2017-10-28: 500 mg via INTRAVENOUS
  Filled 2017-10-28: qty 500

## 2017-10-28 MED ORDER — ONDANSETRON HCL 4 MG/2ML IJ SOLN
INTRAMUSCULAR | Status: DC | PRN
  Administered 2017-10-28: 4 mg via INTRAVENOUS

## 2017-10-28 MED ORDER — PHENYLEPHRINE 8 MG IN D5W 100 ML (0.08MG/ML) PREMIX OPTIME
INJECTION | INTRAVENOUS | Status: DC | PRN
  Administered 2017-10-28: 60 ug/min via INTRAVENOUS

## 2017-10-28 MED ORDER — LACTATED RINGERS IV SOLN
INTRAVENOUS | Status: DC | PRN
  Administered 2017-10-28: 23:00:00 via INTRAVENOUS

## 2017-10-28 MED ORDER — LACTATED RINGERS IV SOLN
INTRAVENOUS | Status: DC
  Administered 2017-10-28 (×3): via INTRAVENOUS

## 2017-10-28 MED ORDER — PHENYLEPHRINE 40 MCG/ML (10ML) SYRINGE FOR IV PUSH (FOR BLOOD PRESSURE SUPPORT)
PREFILLED_SYRINGE | INTRAVENOUS | Status: AC
  Filled 2017-10-28: qty 10

## 2017-10-28 MED ORDER — FENTANYL CITRATE (PF) 100 MCG/2ML IJ SOLN
INTRAMUSCULAR | Status: AC
  Filled 2017-10-28: qty 2

## 2017-10-28 MED ORDER — CEFAZOLIN SODIUM-DEXTROSE 2-4 GM/100ML-% IV SOLN
2.0000 g | Freq: Once | INTRAVENOUS | Status: AC
  Administered 2017-10-28: 2 g via INTRAVENOUS

## 2017-10-28 MED ORDER — OXYCODONE-ACETAMINOPHEN 5-325 MG PO TABS: 1.0000 | ORAL_TABLET | ORAL | Status: DC | PRN

## 2017-10-28 MED ORDER — SOD CITRATE-CITRIC ACID 500-334 MG/5ML PO SOLN
30.0000 mL | ORAL | Status: DC | PRN
  Administered 2017-10-28: 30 mL via ORAL
  Filled 2017-10-28: qty 15

## 2017-10-28 MED ORDER — BUPIVACAINE IN DEXTROSE 0.75-8.25 % IT SOLN
INTRATHECAL | Status: DC | PRN
  Administered 2017-10-28: 1.5 mL via INTRATHECAL

## 2017-10-28 MED ORDER — ACETAMINOPHEN 325 MG PO TABS: 650.0000 mg | ORAL_TABLET | ORAL | Status: DC | PRN

## 2017-10-28 MED ORDER — OXYCODONE-ACETAMINOPHEN 5-325 MG PO TABS: 2.0000 | ORAL_TABLET | ORAL | Status: DC | PRN

## 2017-10-28 MED ORDER — OXYTOCIN 10 UNIT/ML IJ SOLN
INTRAVENOUS | Status: DC | PRN
  Administered 2017-10-28: 40 [IU] via INTRAVENOUS

## 2017-10-28 MED ORDER — SCOPOLAMINE 1 MG/3DAYS TD PT72
MEDICATED_PATCH | TRANSDERMAL | Status: AC
  Filled 2017-10-28: qty 1

## 2017-10-28 MED ORDER — OXYTOCIN 40 UNITS IN LACTATED RINGERS INFUSION - SIMPLE MED
2.5000 [IU]/h | INTRAVENOUS | Status: DC
  Filled 2017-10-28: qty 1000

## 2017-10-28 MED ORDER — LACTATED RINGERS IV SOLN: 500.0000 mL | INTRAVENOUS | Status: DC | PRN

## 2017-10-28 MED ORDER — OXYTOCIN BOLUS FROM INFUSION: 500.0000 mL | Freq: Once | INTRAVENOUS | Status: DC

## 2017-10-28 MED ORDER — SCOPOLAMINE 1 MG/3DAYS TD PT72
MEDICATED_PATCH | TRANSDERMAL | Status: DC | PRN
  Administered 2017-10-28: 1 via TRANSDERMAL

## 2017-10-28 MED ORDER — LIDOCAINE HCL (PF) 1 % IJ SOLN
30.0000 mL | INTRAMUSCULAR | Status: DC | PRN
  Filled 2017-10-28: qty 30

## 2017-10-28 MED ORDER — ONDANSETRON HCL 4 MG/2ML IJ SOLN: 4.0000 mg | Freq: Four times a day (QID) | INTRAMUSCULAR | Status: DC | PRN

## 2017-10-28 MED ORDER — FENTANYL CITRATE (PF) 100 MCG/2ML IJ SOLN
INTRAMUSCULAR | Status: DC | PRN
  Administered 2017-10-28: 15 ug via INTRATHECAL

## 2017-10-28 MED ORDER — MORPHINE SULFATE (PF) 0.5 MG/ML IJ SOLN
INTRAMUSCULAR | Status: DC | PRN
  Administered 2017-10-28: .15 mg via INTRATHECAL

## 2017-10-28 MED ORDER — FENTANYL CITRATE (PF) 100 MCG/2ML IJ SOLN
100.0000 ug | INTRAMUSCULAR | Status: DC | PRN
  Administered 2017-10-28 (×2): 100 ug via INTRAVENOUS
  Filled 2017-10-28: qty 2

## 2017-10-28 MED ORDER — SODIUM CHLORIDE 0.9 % IR SOLN
Status: DC | PRN
  Administered 2017-10-28: 1

## 2017-10-28 SURGICAL SUPPLY — 35 items
APL SKNCLS STERI-STRIP NONHPOA (GAUZE/BANDAGES/DRESSINGS) ×1
BENZOIN TINCTURE PRP APPL 2/3 (GAUZE/BANDAGES/DRESSINGS) ×3 IMPLANT
CHLORAPREP W/TINT 26ML (MISCELLANEOUS) ×3 IMPLANT
CLAMP CORD UMBIL (MISCELLANEOUS) IMPLANT
CLOSURE WOUND 1/2 X4 (GAUZE/BANDAGES/DRESSINGS) ×1
CLOTH BEACON ORANGE TIMEOUT ST (SAFETY) ×3 IMPLANT
DRSG OPSITE POSTOP 4X10 (GAUZE/BANDAGES/DRESSINGS) ×3 IMPLANT
ELECT REM PT RETURN 9FT ADLT (ELECTROSURGICAL) ×3
ELECTRODE REM PT RTRN 9FT ADLT (ELECTROSURGICAL) ×1 IMPLANT
EXTRACTOR VACUUM M CUP 4 TUBE (SUCTIONS) IMPLANT
EXTRACTOR VACUUM M CUP 4' TUBE (SUCTIONS)
GLOVE BIOGEL PI IND STRL 7.0 (GLOVE) ×2 IMPLANT
GLOVE BIOGEL PI IND STRL 7.5 (GLOVE) ×2 IMPLANT
GLOVE BIOGEL PI INDICATOR 7.0 (GLOVE) ×4
GLOVE BIOGEL PI INDICATOR 7.5 (GLOVE) ×4
GLOVE ECLIPSE 7.5 STRL STRAW (GLOVE) ×3 IMPLANT
GOWN STRL REUS W/TWL LRG LVL3 (GOWN DISPOSABLE) ×9 IMPLANT
KIT ABG SYR 3ML LUER SLIP (SYRINGE) IMPLANT
NDL HYPO 25X5/8 SAFETYGLIDE (NEEDLE) IMPLANT
NEEDLE HYPO 25X5/8 SAFETYGLIDE (NEEDLE) IMPLANT
NS IRRIG 1000ML POUR BTL (IV SOLUTION) ×3 IMPLANT
PACK C SECTION WH (CUSTOM PROCEDURE TRAY) ×3 IMPLANT
PAD OB MATERNITY 4.3X12.25 (PERSONAL CARE ITEMS) ×3 IMPLANT
PENCIL SMOKE EVAC W/HOLSTER (ELECTROSURGICAL) ×3 IMPLANT
RTRCTR C-SECT PINK 25CM LRG (MISCELLANEOUS) ×3 IMPLANT
SPONGE LAP 18X18 RF (DISPOSABLE) ×9 IMPLANT
STRIP CLOSURE SKIN 1/2X4 (GAUZE/BANDAGES/DRESSINGS) ×2 IMPLANT
SUT VIC AB 0 CTX 36 (SUTURE) ×9
SUT VIC AB 0 CTX36XBRD ANBCTRL (SUTURE) ×3 IMPLANT
SUT VIC AB 2-0 CT1 27 (SUTURE) ×3
SUT VIC AB 2-0 CT1 TAPERPNT 27 (SUTURE) ×1 IMPLANT
SUT VIC AB 4-0 KS 27 (SUTURE) ×3 IMPLANT
TOWEL OR 17X24 6PK STRL BLUE (TOWEL DISPOSABLE) ×3 IMPLANT
TRAY FOLEY W/BAG SLVR 14FR LF (SET/KITS/TRAYS/PACK) ×3 IMPLANT
WATER STERILE IRR 1000ML POUR (IV SOLUTION) ×2 IMPLANT

## 2017-10-28 NOTE — Consult Note (Signed)
Neonatology Note:   Attendance at C-section:    I was asked by Dr. Stinson to attend this C/S at term due to FTP, transverse position. The mother is a G6P3023, GBS neg with good prenatal care. ROM 6 hours before delivery, fluid clear. Delivered feet first.  Infant not vigorous nor with good spontaneous cry and tone. +30 sec DCC.  Infant dried and stimulated without response so cord cut and baby brought to warmer.  HR ~80 with significant peripheral vasoconstriction and no resp effort and poor tone.  PPV started with great response. SaO2 placed and in 90s with HR 160s.  Stopped PPV after 1min and gave brief bbO2. Sao2 maintained on RA without WOB.  Tone and color also improve and remained appropriate.  Bulb suctioned fair amount of fluid from oropharynx. Ap 4/8. Lungs clear to ausc in DR. To CN to care of Pediatrician.  David C. Ehrmann, MD 

## 2017-10-28 NOTE — Progress Notes (Signed)
Patient ID: Maria Chapman, female   DOB: Aug 05, 1980, 37 y.o.   MRN: [id number]  Intense contractions every 2-3 minutes.  Vitals normal  Category 1 tracing x2  Dilation: 6.5 Effacement (%): 100 Cervical Position: Anterior Station: 0, -1 Presentation: Vertex Exam by:: Adrian BlackwaterStinson, MD  Fentanyl for pain control. Expect vaginal delivery  Maria HeritageJacob J Stinson, DO 10/28/2017 7:49 PM

## 2017-10-28 NOTE — MAU Note (Signed)
Pt presents with contractions, denies bleeding or ROM 

## 2017-10-28 NOTE — Op Note (Signed)
[facility] PROCEDURE DATE: 10/28/2017  PREOPERATIVE DIAGNOSIS: Intrauterine pregnancy at  5558w3d weeks gestation; failed breech extraction, transverse back down lie.  POSTOPERATIVE DIAGNOSIS: The same  PROCEDURE: Primary Low Transverse Cesarean Section  SURGEON:  Dr. Candelaria CelesteJacob Tegan Britain  ASSISTANT: Charyl DancerErin Horst, PA-S  INDICATIONS: Ma RingsSheneke C Berkemeier is a 37 y.o. [phone] at 5058w3d scheduled for cesarean section secondary to failed breech extraction, transverse back down lie..  The risks of cesarean section discussed with the patient included but were not limited to: bleeding which may require transfusion or reoperation; infection which may require antibiotics; injury to bowel, bladder, ureters or other surrounding organs; injury to the fetus; need for additional procedures including hysterectomy in the event of a life-threatening hemorrhage; placental abnormalities wth subsequent pregnancies, incisional problems, thromboembolic phenomenon and other postoperative/anesthesia complications. The patient concurred with the proposed plan, giving informed written consent for the procedure.    FINDINGS:  Viable female infant in transverse back down presentation.  Apgars 9 and 9, weight 6 pounds and 3 ounces.  Clear amniotic fluid.  Intact placenta, three vessel cord.  Normal uterus, fallopian tubes and ovaries bilaterally.  ANESTHESIA:    Spinal INTRAVENOUS FLUIDS: 2500 ml ESTIMATED BLOOD LOSS: 506 ml URINE OUTPUT:  150 ml SPECIMENS: Placenta sent to pathology COMPLICATIONS: None immediate  PROCEDURE IN DETAIL:  The patient received intravenous antibiotics and had sequential compression devices applied to her lower extremities while in the preoperative area.  She was then taken to the operating room where spinal anesthesia was administered and was found to be adequate. She was then placed in a dorsal supine position with a leftward tilt, and prepped and draped in a sterile manner.  A foley catheter was placed  into her bladder and attached to constant gravity, which drained clear fluid throughout.  After an adequate timeout was performed, a Pfannenstiel skin incision was made with scalpel and carried through to the underlying layer of fascia. The fascia was incised in the midline and this incision was extended bilaterally using the Mayo scissors. Kocher clamps were applied to the superior aspect of the fascial incision and the underlying rectus muscles were dissected off bluntly. A similar process was carried out on the inferior aspect of the facial incision. The rectus muscles were separated in the midline bluntly and the peritoneum was entered bluntly. An Alexis retractor was placed to aid in visualization of the uterus.  Attention was turned to the lower uterine segment where a transverse hysterotomy was made with a scalpel and extended bilaterally bluntly. The infant was successfully delivered, and cord was clamped and cut and infant was handed over to awaiting neonatology team. Uterine massage was then administered and the placenta delivered intact with three-vessel cord. The uterus was then cleared of clot and debris.  The hysterotomy was closed with 0 Vicryl in a running locked fashion, and an imbricating layer was also placed with a 0 Vicryl. Overall, excellent hemostasis was noted. The abdomen and the pelvis were cleared of all clot and debris and the Jon Gillslexis was removed. Hemostasis was confirmed on all surfaces.  The peritoneum was reapproximated using 2-0 vicryl running stitches. The fascia was then closed using 0 Vicryl in a running fashion. The skin was closed with 4-0 vicryl. The patient tolerated the procedure well. Sponge, lap, instrument and needle counts were correct x 2. She was taken to the recovery room in stable condition.    Levie HeritageStinson, Haru Anspaugh J, DO 10/29/2017 12:00 AM

## 2017-10-28 NOTE — MAU Note (Signed)
Ongoing ctx  Planning to be checked by Dr Adrian BlackwaterStinson  Some spotting from cervical exam yesterday  Mucus discharge  + FM

## 2017-10-28 NOTE — H&P (Addendum)
Maria Chapman is a 37 y.o. female presenting for spontaneous labor, contractions starting yesterday. OB History    Gravida  6   Para  3   Term  3   Preterm      AB  2   Living  3     SAB      TAB  1   Ectopic      Multiple      Live Births  3          Past Medical History:  Diagnosis Date  . Syphilis    Past Surgical History:  Procedure Laterality Date  . NO PAST SURGERIES     Family History: family history includes Cancer in her maternal grandfather; Cancer (age of onset: 730) in her maternal aunt; Diabetes in her maternal grandmother. Social History:  reports that she has quit smoking. She has never used smokeless tobacco. She reports that she has current or past drug history. Drug: Marijuana. She reports that she does not drink alcohol.     Maternal Diabetes: No Genetic Screening: Normal Maternal Ultrasounds/Referrals: Normal Fetal Ultrasounds or other Referrals:  Referred to Materal Fetal Medicine  Maternal Substance Abuse:  No Significant Maternal Medications:  Meds include: Zantac Significant Maternal Lab Results:  Lab values include: Group B Strep negative Other Comments:  None  ROS History Dilation: 4.5 Effacement (%): 80 Station: -2 Exam by:: Nakira Litzau, DO Blood pressure 124/76, pulse (!) 102, temperature 98.4 F (36.9 C), temperature source Oral, resp. rate 18, last menstrual period 02/09/2017, unknown if currently breastfeeding. Maternal Exam:  Uterine Assessment: Contraction strength is moderate.  Contraction duration is 90 seconds. Contraction frequency is regular.   Abdomen: Patient reports no abdominal tenderness. Fundal height is consistent with twins.    Introitus: Normal vulva. Normal vagina.  Ferning test: not done.  Nitrazine test: not done. Amniotic fluid character: not assessed.  Pelvis: adequate for delivery.   Cervix: Cervix evaluated by digital exam.     Physical Exam  Constitutional: She appears well-developed and  well-nourished.  HENT:  Head: Normocephalic and atraumatic.  Cardiovascular: Normal rate.  Respiratory: Effort normal.  GI: Soft. She exhibits no distension. There is no tenderness.  Skin: Skin is warm and dry.  Psychiatric: She has a normal mood and affect. Her behavior is normal. Judgment and thought content normal.    Dilation: 4.5 Effacement (%): 80 Station: -2 Presentation: Vertex Exam by:: Shalena Ezzell, DO   Prenatal labs: ABO, Rh: --/--/B POS (12/14 1424) Antibody:   Rubella:   RPR: Non Reactive (07/31 0844)  HBsAg:    HIV: Non Reactive (07/31 0844)  GBS:     Assessment/Plan: Admit, will AROM. Vtx/breech on last US. Baby A confirmed Vtx. Breast Uncertain about contraception GBS negative.   Levie HeritageJacob J Chamika Cunanan 10/28/2017, 3:52 PM

## 2017-10-28 NOTE — Anesthesia Preprocedure Evaluation (Signed)
Anesthesia Evaluation  Patient identified by MRN, date of birth, ID band Patient awake    Reviewed: Allergy & Precautions, NPO status , Patient's Chart, lab work & pertinent test results  Airway Mallampati: II  TM Distance: >3 FB Neck ROM: Full    Dental no notable dental hx. (+) Teeth Intact   Pulmonary neg pulmonary ROS, former smoker,    Pulmonary exam normal breath sounds clear to auscultation       Cardiovascular Exercise Tolerance: Good negative cardio ROS Normal cardiovascular exam Rhythm:Regular Rate:Normal     Neuro/Psych negative neurological ROS     GI/Hepatic   Endo/Other    Renal/GU      Musculoskeletal   Abdominal   Peds  Hematology   Anesthesia Other Findings   Reproductive/Obstetrics (+) Pregnancy                             Lab Results  Component Value Date   WBC 5.5 10/28/2017   HGB 11.1 (L) 10/28/2017   HCT 33.0 (L) 10/28/2017   MCV 87.1 10/28/2017   PLT 177 10/28/2017    Anesthesia Physical Anesthesia Plan  ASA: II  Anesthesia Plan: Spinal   Post-op Pain Management:    Induction:   PONV Risk Score and Plan: Treatment may vary due to age or medical condition  Airway Management Planned: Natural Airway and Nasal Cannula  Additional Equipment:   Intra-op Plan:   Post-operative Plan:   Informed Consent: I have reviewed the patients History and Physical, chart, labs and discussed the procedure including the risks, benefits and alternatives for the proposed anesthesia with the patient or authorized representative who has indicated his/her understanding and acceptance.   Dental advisory given  Plan Discussed with:   Anesthesia Plan Comments:         Anesthesia Quick Evaluation

## 2017-10-28 NOTE — MAU Provider Note (Signed)
Patient Name: Maria Chapman, female   DOB: 01-13-1981, 37 y.o.  MRN: [id number]  Pt presents for reevaluation for ctxs. Pt 4-5cm. Will admit.  See H&P note.  Levie HeritageJacob J Stinson, DO 10/28/2017 3:49 PM

## 2017-10-28 NOTE — MAU Provider Note (Signed)
  History     CSN: [id number]  Arrival date and time: 10/28/17 1018   None     Chief Complaint  Patient presents with  . Contractions   HPI [id number], 5432w2d. Seen for labor check. Contractions since last nigh. Good fetal movement.  OB History    Gravida  6   Para  3   Term  3   Preterm      AB  2   Living  3     SAB      TAB  1   Ectopic      Multiple      Live Births  3           Past Medical History:  Diagnosis Date  . Syphilis     Past Surgical History:  Procedure Laterality Date  . NO PAST SURGERIES      Family History  Problem Relation Age of Onset  . Cancer Maternal Aunt 30       breast CA  . Diabetes Maternal Grandmother   . Cancer Maternal Grandfather   . Hypertension Neg Hx     Social History   Tobacco Use  . Smoking status: Former Games developermoker  . Smokeless tobacco: Never Used  Substance Use Topics  . Alcohol use: No  . Drug use: Not Currently    Types: Marijuana    Allergies:  Allergies  Allergen Reactions  . Penicillin G     Hives  . Penicillins Hives    Medications Prior to Admission  Medication Sig Dispense Refill Last Dose  . Doxylamine-Pyridoxine (DICLEGIS) 10-10 MG TBEC Take by mouth.   Not Taking  . Prenatal Vit-Fe Fumarate-FA (MULTIVITAMIN-PRENATAL) 27-0.8 MG TABS tablet Take 1 tablet by mouth daily at 12 noon.   Taking    Review of Systems  All other systems reviewed and are negative.  Physical Exam   Weight 83.5 kg, last menstrual period 02/09/2017, unknown if currently breastfeeding.  Physical Exam  Constitutional: She appears well-developed and well-nourished.  Respiratory: Effort normal. No respiratory distress.  GI: Soft. She exhibits no distension. There is no tenderness.  Skin: Skin is warm and dry.  Psychiatric: She has a normal mood and affect. Her behavior is normal. Judgment and thought content normal.   Dilation: 3.5 Effacement (%): 80 Cervical Position: Posterior Station: -2 Presentation:  Vertex Exam by:: Stinson, DO   MAU Course  Procedures NST x2: 130, mod variability, + accel. No decel; 130s mod variability, +accel, no decel. Contractions every 7-8 minutes.  MDM   Assessment and Plan  1. Early labor 2. Di/DI twins - NST category 1 x2. D/c to home. Return with worsening contractions   Levie HeritageJacob J Stinson 10/28/2017, 10:50 AM

## 2017-10-28 NOTE — Telephone Encounter (Signed)
Preadmission screen  

## 2017-10-28 NOTE — Anesthesia Procedure Notes (Signed)
Spinal  Patient location during procedure: OB Start time: 10/28/2017 10:57 PM End time: 10/28/2017 11:01 PM Staffing Anesthesiologist: Trevor IhaHouser, Stephen A, MD Performed: anesthesiologist  Preanesthetic Checklist Completed: patient identified, surgical consent, pre-op evaluation, timeout performed, IV checked, risks and benefits discussed and monitors and equipment checked Spinal Block Patient position: right lateral decubitus Prep: site prepped and draped and DuraPrep Patient monitoring: heart rate, cardiac monitor, continuous pulse ox and blood pressure Approach: midline Location: L3-4 Injection technique: single-shot Needle Needle type: Pencan  Needle gauge: 24 G Needle length: 10 cm Needle insertion depth: 5 cm Assessment Sensory level: T4 Additional Notes 1 attempt. patient tolerated procedure well

## 2017-10-28 NOTE — Progress Notes (Signed)
Patient ID: Maria Chapman, female   DOB: [birth year], 37 y.o.   MRN: [id number]  Doing okay with contractions - getting stronger and more regular. NST category 1 x2 Ctxs every 4-5 minutes  AROM with clear fluid.  Continue expectant management.  Levie HeritageJacob J Stinson, DO 5:36 PM 10/28/2017

## 2017-10-29 ENCOUNTER — Encounter (HOSPITAL_COMMUNITY): Payer: Self-pay | Admitting: Family Medicine

## 2017-10-29 ENCOUNTER — Other Ambulatory Visit: Payer: Self-pay

## 2017-10-29 LAB — CBC
HEMATOCRIT: 24.5 % — AB (ref 36.0–46.0)
Hemoglobin: 8.4 g/dL — ABNORMAL LOW (ref 12.0–15.0)
MCH: 29.7 pg (ref 26.0–34.0)
MCHC: 34.7 g/dL (ref 30.0–36.0)
MCV: 85.7 fL (ref 78.0–100.0)
Platelets: 145 10*3/uL — ABNORMAL LOW (ref 150–400)
RBC: 2.86 MIL/uL — AB (ref 3.87–5.11)
RDW: 12.7 % (ref 11.5–15.5)
WBC: 15 10*3/uL — ABNORMAL HIGH (ref 4.0–10.5)

## 2017-10-29 LAB — RPR: RPR: NONREACTIVE

## 2017-10-29 MED ORDER — LACTATED RINGERS IV SOLN
INTRAVENOUS | Status: DC
  Administered 2017-10-29: 04:00:00 via INTRAVENOUS

## 2017-10-29 MED ORDER — NALBUPHINE HCL 10 MG/ML IJ SOLN
5.0000 mg | Freq: Once | INTRAMUSCULAR | Status: DC | PRN
  Filled 2017-10-29: qty 1

## 2017-10-29 MED ORDER — KETOROLAC TROMETHAMINE 30 MG/ML IJ SOLN: 30.0000 mg | Freq: Four times a day (QID) | INTRAMUSCULAR | Status: AC | PRN

## 2017-10-29 MED ORDER — SENNOSIDES-DOCUSATE SODIUM 8.6-50 MG PO TABS
2.0000 | ORAL_TABLET | ORAL | Status: DC
  Administered 2017-10-29 – 2017-10-30 (×2): 2 via ORAL
  Filled 2017-10-29 (×2): qty 2

## 2017-10-29 MED ORDER — WITCH HAZEL-GLYCERIN EX PADS: 1.0000 "application " | MEDICATED_PAD | CUTANEOUS | Status: DC | PRN

## 2017-10-29 MED ORDER — NALBUPHINE HCL 10 MG/ML IJ SOLN: 5.0000 mg | Freq: Once | INTRAMUSCULAR | Status: DC | PRN

## 2017-10-29 MED ORDER — OXYCODONE-ACETAMINOPHEN 5-325 MG PO TABS
1.0000 | ORAL_TABLET | ORAL | Status: DC | PRN
  Administered 2017-10-29: 1 via ORAL
  Filled 2017-10-29: qty 1

## 2017-10-29 MED ORDER — NALBUPHINE HCL 10 MG/ML IJ SOLN
5.0000 mg | INTRAMUSCULAR | Status: DC | PRN
  Administered 2017-10-29: 5 mg via INTRAVENOUS

## 2017-10-29 MED ORDER — OXYTOCIN 40 UNITS IN LACTATED RINGERS INFUSION - SIMPLE MED: 2.5000 [IU]/h | INTRAVENOUS | Status: AC

## 2017-10-29 MED ORDER — DIPHENHYDRAMINE HCL 50 MG/ML IJ SOLN
12.5000 mg | INTRAMUSCULAR | Status: DC | PRN
  Administered 2017-10-29: 12.5 mg via INTRAVENOUS

## 2017-10-29 MED ORDER — MENTHOL 3 MG MT LOZG: 1.0000 | LOZENGE | OROMUCOSAL | Status: DC | PRN

## 2017-10-29 MED ORDER — DIPHENHYDRAMINE HCL 50 MG/ML IJ SOLN
INTRAMUSCULAR | Status: AC
  Administered 2017-10-29: 12.5 mg via INTRAVENOUS
  Filled 2017-10-29: qty 1

## 2017-10-29 MED ORDER — ONDANSETRON HCL 4 MG/2ML IJ SOLN: 4.0000 mg | Freq: Three times a day (TID) | INTRAMUSCULAR | Status: DC | PRN

## 2017-10-29 MED ORDER — DIBUCAINE 1 % RE OINT: 1.0000 "application " | TOPICAL_OINTMENT | RECTAL | Status: DC | PRN

## 2017-10-29 MED ORDER — SIMETHICONE 80 MG PO CHEW
80.0000 mg | CHEWABLE_TABLET | ORAL | Status: DC
  Filled 2017-10-29: qty 1

## 2017-10-29 MED ORDER — MEPERIDINE HCL 25 MG/ML IJ SOLN: 6.2500 mg | INTRAMUSCULAR | Status: DC | PRN

## 2017-10-29 MED ORDER — NALOXONE HCL 0.4 MG/ML IJ SOLN: 0.4000 mg | INTRAMUSCULAR | Status: DC | PRN

## 2017-10-29 MED ORDER — NALBUPHINE HCL 10 MG/ML IJ SOLN: 5.0000 mg | INTRAMUSCULAR | Status: DC | PRN

## 2017-10-29 MED ORDER — DIPHENHYDRAMINE HCL 25 MG PO CAPS
25.0000 mg | ORAL_CAPSULE | ORAL | Status: DC | PRN
  Filled 2017-10-29: qty 1

## 2017-10-29 MED ORDER — KETOROLAC TROMETHAMINE 30 MG/ML IJ SOLN
30.0000 mg | Freq: Four times a day (QID) | INTRAMUSCULAR | Status: AC | PRN
  Administered 2017-10-29: 30 mg via INTRAMUSCULAR

## 2017-10-29 MED ORDER — HYDROCODONE-ACETAMINOPHEN 7.5-325 MG PO TABS: 1.0000 | ORAL_TABLET | Freq: Once | ORAL | Status: DC | PRN

## 2017-10-29 MED ORDER — PROMETHAZINE HCL 25 MG/ML IJ SOLN: 6.2500 mg | INTRAMUSCULAR | Status: DC | PRN

## 2017-10-29 MED ORDER — ACETAMINOPHEN 10 MG/ML IV SOLN: 1000.0000 mg | Freq: Once | INTRAVENOUS | Status: DC | PRN

## 2017-10-29 MED ORDER — LACTATED RINGERS IV SOLN: INTRAVENOUS | Status: DC

## 2017-10-29 MED ORDER — SIMETHICONE 80 MG PO CHEW
80.0000 mg | CHEWABLE_TABLET | Freq: Three times a day (TID) | ORAL | Status: DC
  Administered 2017-10-29 – 2017-10-30 (×2): 80 mg via ORAL
  Filled 2017-10-29 (×2): qty 1

## 2017-10-29 MED ORDER — SCOPOLAMINE 1 MG/3DAYS TD PT72
1.0000 | MEDICATED_PATCH | Freq: Once | TRANSDERMAL | Status: DC
  Filled 2017-10-29: qty 1

## 2017-10-29 MED ORDER — DIPHENHYDRAMINE HCL 25 MG PO CAPS: 25.0000 mg | ORAL_CAPSULE | Freq: Four times a day (QID) | ORAL | Status: DC | PRN

## 2017-10-29 MED ORDER — TETANUS-DIPHTH-ACELL PERTUSSIS 5-2.5-18.5 LF-MCG/0.5 IM SUSP: 0.5000 mL | Freq: Once | INTRAMUSCULAR | Status: DC

## 2017-10-29 MED ORDER — SODIUM CHLORIDE 0.9% FLUSH: 3.0000 mL | INTRAVENOUS | Status: DC | PRN

## 2017-10-29 MED ORDER — PRENATAL MULTIVITAMIN CH
1.0000 | ORAL_TABLET | Freq: Every day | ORAL | Status: DC
  Administered 2017-10-29 – 2017-10-30 (×2): 1 via ORAL
  Filled 2017-10-29 (×2): qty 1

## 2017-10-29 MED ORDER — COCONUT OIL OIL: 1.0000 "application " | TOPICAL_OIL | Status: DC | PRN

## 2017-10-29 MED ORDER — SIMETHICONE 80 MG PO CHEW: 80.0000 mg | CHEWABLE_TABLET | ORAL | Status: DC | PRN

## 2017-10-29 MED ORDER — HYDROMORPHONE HCL 1 MG/ML IJ SOLN: 0.2500 mg | INTRAMUSCULAR | Status: DC | PRN

## 2017-10-29 MED ORDER — KETOROLAC TROMETHAMINE 30 MG/ML IJ SOLN
INTRAMUSCULAR | Status: AC
  Administered 2017-10-29: 30 mg via INTRAMUSCULAR
  Filled 2017-10-29: qty 1

## 2017-10-29 MED ORDER — OXYCODONE-ACETAMINOPHEN 5-325 MG PO TABS
2.0000 | ORAL_TABLET | ORAL | Status: DC | PRN
  Administered 2017-10-29 – 2017-10-30 (×3): 2 via ORAL
  Filled 2017-10-29 (×3): qty 2

## 2017-10-29 MED ORDER — NALOXONE HCL 4 MG/10ML IJ SOLN
1.0000 ug/kg/h | INTRAVENOUS | Status: DC | PRN
  Filled 2017-10-29: qty 5

## 2017-10-29 MED ORDER — ZOLPIDEM TARTRATE 5 MG PO TABS: 5.0000 mg | ORAL_TABLET | Freq: Every evening | ORAL | Status: DC | PRN

## 2017-10-29 MED ORDER — IBUPROFEN 600 MG PO TABS
600.0000 mg | ORAL_TABLET | Freq: Four times a day (QID) | ORAL | Status: DC
  Administered 2017-10-29 – 2017-10-30 (×5): 600 mg via ORAL
  Filled 2017-10-29 (×5): qty 1

## 2017-10-29 MED ORDER — ACETAMINOPHEN 325 MG PO TABS
650.0000 mg | ORAL_TABLET | ORAL | Status: DC | PRN
  Administered 2017-10-29: 650 mg via ORAL
  Filled 2017-10-29: qty 2

## 2017-10-29 NOTE — Lactation Note (Signed)
This note was copied from a baby's chart. Lactation Consultation Note  Patient Name: Maria Chapman [id number] Date: 10/29/2017   Twins [redacted]w[redacted]d.  Baby B Girl < 6 lbs. Mother breastfed her 3 other children for 3 months. She states she knows how to hand express. Encouraged giving baby colostrum on spoon. Mom has my # to call for assist w/next feeding. Discussed setting up mother w/ DEBP when she calls [place] back. Mom made aware of O/P services, breastfeeding support groups, community resources, and our phone # for post-discharge questions.       Maternal Data    Feeding Feeding Type: Breast Fed  LATCH Score Latch: Grasps breast easily, tongue down, lips flanged, rhythmical sucking.  Audible Swallowing: A few with stimulation  Type of Nipple: Everted at rest and after stimulation  Comfort (Breast/Nipple): Soft / non-tender  Hold (Positioning): No assistance needed to correctly position infant at breast.  LATCH Score: 9  Interventions    Lactation Tools Discussed/Used     Consult Status      Dahlia Byes [facility] 10/29/2017, 9:53 AM

## 2017-10-29 NOTE — Lactation Note (Signed)
This note was copied from a baby's chart. Lactation Consultation Note  Patient Name: Maria Chapman [id number] Date: 10/29/2017 Reason for consult: Initial assessment;Early term 37-38.6wks   LC returned to room to observe breastfeeding. Baby B Girl latched in football first with intermittent sucks and swallows, came off and latched herself back on the R breast. Baby A Maria was sleepy.  LC allowed baby to begin a sucking pattern on gloved finger then he latched on L breast. Mom encouraged to feed baby 8-12 times/24 hours and with feeding cues at least q 3 hours. Discussed supplementing if twins become sleepy at the breast.      Maternal Data Has patient been taught Hand Expression?: Yes Does the patient have breastfeeding experience prior to this delivery?: Yes  Feeding Feeding Type: Breast Fed Length of feed: 20 min  LATCH Score Latch: Grasps breast easily, tongue down, lips flanged, rhythmical sucking.  Audible Swallowing: A few with stimulation  Type of Nipple: Everted at rest and after stimulation  Comfort (Breast/Nipple): Soft / non-tender  Hold (Positioning): No assistance needed to correctly position infant at breast.  LATCH Score: 9  Interventions Interventions: Hand express;DEBP  Lactation Tools Discussed/Used Pump Review: Setup, frequency, and cleaning;Milk Storage Initiated by:: Dahlia Byes Date initiated:: 10/29/17   Consult Status Consult Status: Follow-up Date: 10/29/17 Follow-up type: In-patient    Dahlia Byes [facility] 10/29/2017, 12:15 PM

## 2017-10-29 NOTE — Progress Notes (Signed)
Subjective: Postpartum Day 1: Vaginal and Cesarean Delivery Patient reports incisional pain and tolerating PO.    Objective: Vital signs in last 24 hours: Temp:  [97.6 F (36.4 C)-98.7 F (37.1 C)] 98.7 F (37.1 C) (09/25 0325) Pulse Rate:  [78-120] 78 (09/25 0325) Resp:  [14-20] 20 (09/25 0325) BP: (101-132)/(63-94) 123/91 (09/25 0325) SpO2:  [99 %-100 %] 99 % (09/25 0325) Weight:  [83.5 kg] 83.5 kg (09/24 1623)  Physical Exam:  General: alert, cooperative and no distress Lochia: appropriate Uterine Fundus: firm Incision: healing well, no significant drainage, no dehiscence DVT Evaluation: No evidence of DVT seen on physical exam. Negative Homan's sign. No cords or calf tenderness. No significant calf/ankle edema.  Recent Labs    10/28/17 1602  HGB 11.1*  HCT 33.0*    Assessment/Plan: Status post Cesarean section. Doing well postoperatively.  Continue current care.  Levie Heritage 10/29/2017, 7:17 AM

## 2017-10-29 NOTE — Lactation Note (Signed)
This note was copied from a baby's chart. Lactation Consultation Note  Patient Name: Maria Chapman [id number] Date: 10/29/2017 Reason for consult: Initial assessment;Early term 94-38.6wks   Twins 13 hours old and sleeping.  Mom has my # to call for assist w/next feeding. Reviewed hand expression.  Drops expressed.   Recommend mother post pump 4-6 times per day for 10-20 min with DEBP on initiation setting. Give baby back volume pumped at the next feeding. Reviewed cleaning and milk storage.     Maternal Data Has patient been taught Hand Expression?: Yes Does the patient have breastfeeding experience prior to this delivery?: Yes  Feeding Feeding Type: Breast Fed Length of feed: 20 min  LATCH Score Latch: Grasps breast easily, tongue down, lips flanged, rhythmical sucking.  Audible Swallowing: A few with stimulation  Type of Nipple: Everted at rest and after stimulation  Comfort (Breast/Nipple): Soft / non-tender  Hold (Positioning): No assistance needed to correctly position infant at breast.  LATCH Score: 9  Interventions Interventions: Hand express;DEBP  Lactation Tools Discussed/Used Pump Review: Setup, frequency, and cleaning;Milk Storage Initiated by:: Dahlia Byes Date initiated:: 10/29/17   Consult Status Consult Status: Follow-up Date: 10/29/17 Follow-up type: In-patient    Dahlia Byes [facility] 10/29/2017, 11:28 AM

## 2017-10-29 NOTE — Transfer of Care (Signed)
Immediate Anesthesia Transfer of Care Note  Patient: Maria Chapman  Procedure(s) Performed: CESAREAN SECTION (N/A )  Patient Location: PACU  Anesthesia Type:Spinal  Level of Consciousness: awake, alert  and oriented  Airway & Oxygen Therapy: Patient Spontanous Breathing  Post-op Assessment: Report given to RN and Post -op Vital signs reviewed and stable  Post vital signs: Reviewed and stable  Last Vitals:  Vitals Value Taken Time  BP    Temp    Pulse    Resp    SpO2      Last Pain:  Vitals:   10/28/17 2112  TempSrc: Oral  PainSc: 8          Complications: No apparent anesthesia complications

## 2017-10-29 NOTE — Anesthesia Postprocedure Evaluation (Signed)
Anesthesia Post Note  Patient: Maria Chapman  Procedure(s) Performed: CESAREAN SECTION (N/A )     Patient location during evaluation: PACU Anesthesia Type: Spinal Level of consciousness: oriented and awake and alert Pain management: pain level controlled Vital Signs Assessment: post-procedure vital signs reviewed and stable Respiratory status: spontaneous breathing, respiratory function stable and patient connected to nasal cannula oxygen Cardiovascular status: blood pressure returned to baseline and stable Postop Assessment: no headache, no backache and no apparent nausea or vomiting Anesthetic complications: no    Last Vitals:  Vitals:   10/29/17 1720 10/29/17 1838  BP: 119/72 99/64  Pulse: 92 82  Resp: 18 16  Temp: 37.3 C 37.3 C  SpO2: 98% 97%    Last Pain:  Vitals:   10/29/17 1846  TempSrc:   PainSc: 5    Pain Goal: Patients Stated Pain Goal: 5 (10/29/17 1846)               Trevor Iha

## 2017-10-30 MED ORDER — OXYCODONE-ACETAMINOPHEN 5-325 MG PO TABS: 1.0000 | ORAL_TABLET | Freq: Four times a day (QID) | ORAL | 0 refills | Status: DC | PRN

## 2017-10-30 MED ORDER — IBUPROFEN 600 MG PO TABS: 600.0000 mg | ORAL_TABLET | Freq: Four times a day (QID) | ORAL | 0 refills | Status: DC

## 2017-10-30 NOTE — Lactation Note (Signed)
This note was copied from a baby's chart. Lactation Consultation Note: Experienced BF mom reports babies are latching well with no pain. LS by RN 8-9 for both babies. Good output. Mom concerned that milk supply is not increasing. Encouragement given. Has Ameda pump for home. Mom reports that she pumped once yesterday but did not obtain any milk. Encouraged to pump 3-4 times today to promote milk supply. No questions at present. Reviewed our phone number, OP appointments and [facility] as resources for support after DC. To call prn. Both babies asleep on mom's chest. Encouraged to call for assist if needed when babies wake for feeding.   Patient Name: Maria Chapman [id number] Date: 10/30/2017 Reason for consult: Follow-up assessment;Multiple gestation   Maternal Data Formula Feeding for Exclusion: No Has patient been taught Hand Expression?: Yes Does the patient have breastfeeding experience prior to this delivery?: Yes  Feeding Feeding Type: Breast Fed Length of feed: 15 min  LATCH Score                   Interventions    Lactation Tools Discussed/Used WIC Program: No   Consult Status Consult Status: Complete    Pamelia Hoit 10/30/2017, 7:38 AM

## 2017-10-30 NOTE — Discharge Instructions (Signed)
Cesarean Delivery, Care After Refer to this sheet in the next few weeks. These instructions provide you with information about caring for yourself after your procedure. Your health care provider may also give you more specific instructions. Your treatment has been planned according to current medical practices, but problems sometimes occur. Call your health care provider if you have any problems or questions after your procedure. What can I expect after the procedure? After the procedure, it is common to have:  A small amount of blood or clear fluid coming from the incision.  Some redness, swelling, and pain in your incision area.  Some abdominal pain and soreness.  Vaginal bleeding (lochia).  Pelvic cramps.  Fatigue.  Follow these instructions at home: Incision care   Follow instructions from your health care provider about how to take care of your incision. Make sure you: ? Wash your hands with soap and water before you change your bandage (dressing). If soap and water are not available, use hand sanitizer. ? If you have a dressing, change it as told by your health care provider. ? Leave stitches (sutures), skin staples, skin glue, or adhesive strips in place. These skin closures may need to stay in place for 2 weeks or longer. If adhesive strip edges start to loosen and curl up, you may trim the loose edges. Do not remove adhesive strips completely unless your health care provider tells you to do that.  Check your incision area every day for signs of infection. Check for: ? More redness, swelling, or pain. ? More fluid or blood. ? Warmth. ? Pus or a bad smell.  When you cough or sneeze, hug a pillow. This helps with pain and decreases the chance of your incision opening up (dehiscing). Do this until your incision heals. Medicines  Take over-the-counter and prescription medicines only as told by your health care provider.  If you were prescribed an antibiotic medicine, take it  as told by your health care provider. Do not stop taking the antibiotic until it is finished. Driving  Do not drive or operate heavy machinery while taking prescription pain medicine. Lifestyle  Do not drink alcohol. This is especially important if you are breastfeeding or taking pain medicine.  Do not use tobacco products, including cigarettes, chewing tobacco, or e-cigarettes. If you need help quitting, ask your health care provider. Tobacco can delay wound healing. Eating and drinking  Drink at least 8 eight-ounce glasses of water every day unless told not to by your health care provider. If you breastfeed, you may need to drink more water than this.  Eat high-fiber foods every day. These foods may help prevent or relieve constipation. High-fiber foods include: ? Whole grain cereals and breads. ? Brown rice. ? Beans. ? Fresh fruits and vegetables. Activity  Return to your normal activities as told by your health care provider. Ask your health care provider what activities are safe for you.  Rest as much as possible. Try to rest or take a nap while your baby is sleeping.  Do not lift anything that is heavier than your baby or 10 lb (4.5 kg) as told by your health care provider.  Ask your health care provider when you can engage in sexual activity. This may depend on your: ? Risk of infection. ? Healing rate. ? Comfort and desire to engage in sexual activity. Bathing  Do not take baths, swim, or use a hot tub until your health care provider approves. Ask your health care provider if  you can take showers. You may only be allowed to take sponge baths until your incision heals. General instructions  Do not use tampons or douches until your health care provider approves.  Wear: ? Loose, comfortable clothing. ? A supportive and well-fitting bra.  Watch for any blood clots that may pass from your vagina. These may look like clumps of dark red, brown, or black discharge.  Keep  your perineum clean and dry as told by your health care provider.  Wipe from front to back when you use the toilet.  If possible, have someone help you care for your baby and help with household activities for a few days after you leave the hospital.  Keep all follow-up visits for you and your baby as told by your health care provider. This is important. Contact a health care provider if:  You have: ? Bad-smelling vaginal discharge. ? Difficulty urinating. ? Pain when urinating. ? A sudden increase or decrease in the frequency of your bowel movements. ? More redness, swelling, or pain around your incision. ? More fluid or blood coming from your incision. ? Pus or a bad smell coming from your incision. ? A fever. ? A rash. ? Little or no interest in activities you used to enjoy. ? Questions about caring for yourself or your baby. ? Nausea.  Your incision feels warm to the touch.  Your breasts turn red or become painful or hard.  You feel unusually sad or worried.  You vomit.  You pass large blood clots from your vagina. If you pass a blood clot, save it to show to your health care provider. Do not flush blood clots down the toilet without showing your health care provider.  You urinate more than usual.  You are dizzy or light-headed.  You have not breastfed and have not had a menstrual period for 12 weeks after delivery.  You stopped breastfeeding and have not had a menstrual period for 12 weeks after stopping breastfeeding. Get help right away if:  You have: ? Pain that does not go away or get better with medicine. ? Chest pain. ? Difficulty breathing. ? Blurred vision or spots in your vision. ? Thoughts about hurting yourself or your baby. ? New pain in your abdomen or in one of your legs. ? A severe headache.  You faint.  You bleed from your vagina so much that you fill two sanitary pads in one hour. This information is not intended to replace advice given to  you by your health care provider. Make sure you discuss any questions you have with your health care provider. Document Released: 10/13/2001 Document Revised: 02/24/2016 Document Reviewed: 12/26/2014 Elsevier Interactive Patient Education  2018 [employer] Vaginal Delivery, Care After Refer to this sheet in the next few weeks. These instructions provide you with information about caring for yourself after vaginal delivery. Your health care provider may also give you more specific instructions. Your treatment has been planned according to current medical practices, but problems sometimes occur. Call your health care provider if you have any problems or questions. What can I expect after the procedure? After vaginal delivery, it is common to have:  Some bleeding from your vagina.  Soreness in your abdomen, your vagina, and the area of skin between your vaginal opening and your anus (perineum).  Pelvic cramps.  Fatigue.  Follow these instructions at home: Medicines  Take over-the-counter and prescription medicines only as told by your health care provider.  If you were prescribed  an antibiotic medicine, take it as told by your health care provider. Do not stop taking the antibiotic until it is finished. Driving   Do not drive or operate heavy machinery while taking prescription pain medicine.  Do not drive for 24 hours if you received a sedative. Lifestyle  Do not drink alcohol. This is especially important if you are breastfeeding or taking medicine to relieve pain.  Do not use tobacco products, including cigarettes, chewing tobacco, or e-cigarettes. If you need help quitting, ask your health care provider. Eating and drinking  Drink at least 8 eight-ounce glasses of water every day unless you are told not to by your health care provider. If you choose to breastfeed your baby, you may need to drink more water than this.  Eat high-fiber foods every day. These foods may help  prevent or relieve constipation. High-fiber foods include: ? Whole grain cereals and breads. ? Brown rice. ? Beans. ? Fresh fruits and vegetables. Activity  Return to your normal activities as told by your health care provider. Ask your health care provider what activities are safe for you.  Rest as much as possible. Try to rest or take a nap when your baby is sleeping.  Do not lift anything that is heavier than your baby or 10 lb (4.5 kg) until your health care provider says that it is safe.  Talk with your health care provider about when you can engage in sexual activity. This may depend on your: ? Risk of infection. ? Rate of healing. ? Comfort and desire to engage in sexual activity. Vaginal Care  If you have an episiotomy or a vaginal tear, check the area every day for signs of infection. Check for: ? More redness, swelling, or pain. ? More fluid or blood. ? Warmth. ? Pus or a bad smell.  Do not use tampons or douches until your health care provider says this is safe.  Watch for any blood clots that may pass from your vagina. These may look like clumps of dark red, brown, or black discharge. General instructions  Keep your perineum clean and dry as told by your health care provider.  Wear loose, comfortable clothing.  Wipe from front to back when you use the toilet.  Ask your health care provider if you can shower or take a bath. If you had an episiotomy or a perineal tear during labor and delivery, your health care provider may tell you not to take baths for a certain length of time.  Wear a bra that supports your breasts and fits you well.  If possible, have someone help you with household activities and help care for your baby for at least a few days after you leave the hospital.  Keep all follow-up visits for you and your baby as told by your health care provider. This is important. Contact a health care provider if:  You have: ? Vaginal discharge that has a bad  smell. ? Difficulty urinating. ? Pain when urinating. ? A sudden increase or decrease in the frequency of your bowel movements. ? More redness, swelling, or pain around your episiotomy or vaginal tear. ? More fluid or blood coming from your episiotomy or vaginal tear. ? Pus or a bad smell coming from your episiotomy or vaginal tear. ? A fever. ? A rash. ? Little or no interest in activities you used to enjoy. ? Questions about caring for yourself or your baby.  Your episiotomy or vaginal tear feels warm to the  touch.  Your episiotomy or vaginal tear is separating or does not appear to be healing.  Your breasts are painful, hard, or turn red.  You feel unusually sad or worried.  You feel nauseous or you vomit.  You pass large blood clots from your vagina. If you pass a blood clot from your vagina, save it to show to your health care provider. Do not flush blood clots down the toilet without having your health care provider look at them.  You urinate more than usual.  You are dizzy or light-headed.  You have not breastfed at all and you have not had a menstrual period for 12 weeks after delivery.  You have stopped breastfeeding and you have not had a menstrual period for 12 weeks after you stopped breastfeeding. Get help right away if:  You have: ? Pain that does not go away or does not get better with medicine. ? Chest pain. ? Difficulty breathing. ? Blurred vision or spots in your vision. ? Thoughts about hurting yourself or your baby.  You develop pain in your abdomen or in one of your legs.  You develop a severe headache.  You faint.  You bleed from your vagina so much that you fill two sanitary pads in one hour. This information is not intended to replace advice given to you by your health care provider. Make sure you discuss any questions you have with your health care provider. Document Released: 01/19/2000 Document Revised: 07/05/2015 Document Reviewed:  02/05/2015 Elsevier Interactive Patient Education  2018 [employer].

## 2017-10-30 NOTE — Discharge Summary (Signed)
OB Discharge Summary     Patient Name: Maria Chapman DOB: [birth year] MRN: [id number]  Date of admission: 10/28/2017 Delivering MD:    Dawnell, Bryant [[id number]]  Alonia, Dibuono [[id number]]  Candelaria Celeste J   Date of discharge: 10/30/2017  Admitting diagnosis: CTX Intrauterine pregnancy: [redacted]w[redacted]d     Secondary diagnosis:  Active Problems:   Spontaneous onset of labor after 37 but before 39 completed weeks gestation with delivery by planned cesarean section  Additional problems: none     Discharge diagnosis: Term Pregnancy Delivered                                                                                                Post partum procedures:none  Augmentation: AROM  Complications: None  Hospital course:  Onset of Labor With Unplanned C/S  37 y.o. yo [id number] at [redacted]w[redacted]d was admitted in Latent Labor on 10/28/2017. Patient had a labor course significant for Twin delivery- baby A delivered vaginally, baby B in transverse position requiring c/s . Membrane Rupture Time/Date:    Marializ, Ferrebee [[id number]]  5:32 PM   Miyana, Mordecai [[id number]]  5:32 PM ,   Kaylan, Yates [[id number]]  10/28/2017   Daritza, Brees [[id number]]  10/28/2017   The patient went for cesarean section due to [facility], and delivered a Viable infant,   Prisha, Hiley [[id number]]  10/28/2017   Varie, Machamer [[id number]]  10/28/2017  Details of operation can be found in separate operative note. Patient had an uncomplicated postpartum course.  She is ambulating,tolerating a regular diet, passing flatus, and urinating well.  Patient is discharged home in stable condition 10/30/17.  Physical exam  Vitals:   10/29/17 1500 10/29/17 1720 10/29/17 1838 10/29/17 2348  BP:  119/72 99/64 103/64  Pulse:  92 82 93  Resp:  18 16 20   Temp:  99.2 F (37.3 C) 99.1 F (37.3 C) 98.4 F (36.9 C)  TempSrc:  Oral Oral Oral  SpO2: 98% 98% 97%    Weight:      Height:       General: alert, cooperative and no distress Lochia: appropriate Uterine Fundus: firm Incision: Healing well with no significant drainage DVT Evaluation: No evidence of DVT seen on physical exam. Labs: Lab Results  Component Value Date   WBC 15.0 (H) 10/29/2017   HGB 8.4 (L) 10/29/2017   HCT 24.5 (L) 10/29/2017   MCV 85.7 10/29/2017   PLT 145 (L) 10/29/2017   No flowsheet data found.  Discharge instruction: per After Visit Summary and "Baby and Me Booklet".  After visit meds:  Allergies as of 10/30/2017      Reactions   Penicillin G    Hives   Penicillins Hives   Has patient had a PCN reaction causing immediate rash, facial/tongue/throat swelling, SOB or lightheadedness with hypotension: No Has patient had a PCN reaction causing severe rash involving mucus membranes or skin necrosis: No Has patient had a PCN reaction that required hospitalization: No Has patient had a PCN reaction occurring within the last 10 years:  No If all of the above answers are "NO", then may proceed with Cephalosporin use.      Medication List    TAKE these medications   acetaminophen 500 MG tablet Commonly known as:  TYLENOL Take 1,000 mg by mouth every 6 (six) hours as needed for moderate pain or headache.   ibuprofen 600 MG tablet Commonly known as:  ADVIL,MOTRIN Take 1 tablet (600 mg total) by mouth every 6 (six) hours.   multivitamin-prenatal 27-0.8 MG Tabs tablet Take 1 tablet by mouth daily at 12 noon.   oxyCODONE-acetaminophen 5-325 MG tablet Commonly known as:  PERCOCET/ROXICET Take 1 tablet by mouth every 6 (six) hours as needed.   ranitidine 150 MG tablet Commonly known as:  ZANTAC Take 150 mg by mouth daily as needed for heartburn.       Diet: routine diet  Activity: Advance as tolerated. Pelvic rest for 6 weeks.   Outpatient follow up:2 weeks Follow up Appt: Future Appointments  Date Time Provider Department Center  11/03/2017  9:15 AM  [facility] [place] [facility]  11/13/2017  9:00 AM Levie Heritage, DO [facility] None  12/04/2017  8:15 AM Adrian Blackwater Rhona Raider, DO [facility] None   Follow up Visit:No follow-ups on file.  Postpartum contraception: Progesterone only pills  Newborn Data:   Andres, Escandon [[id number]]  Live born female  Birth Weight: 6 lb 2.9 oz (2804 g) APGAR: 9, 9  Newborn Delivery   Birth date/time:  10/28/2017 21:41:00 Delivery type:  Vaginal, Spontaneous      Kara, Melching [[id number]]  Live born female  Birth Weight: 5 lb 5.9 oz (2435 g) APGAR: 4, 8  Newborn Delivery   Birth date/time:  10/28/2017 23:21:00 Delivery type:  C-Section, Low Transverse Trial of labor:  Yes C-section categorization:  Primary     Baby Feeding: Breast Disposition:home with mother   10/30/2017 Denzil Hughes, MD

## 2017-11-02 ENCOUNTER — Inpatient Hospital Stay (HOSPITAL_COMMUNITY): Admission: RE | Admit: 2017-11-02 | Payer: Medicaid Other | Source: Ambulatory Visit

## 2017-11-03 ENCOUNTER — Ambulatory Visit (HOSPITAL_COMMUNITY)
Admission: RE | Admit: 2017-11-03 | Discharge: 2017-11-03 | Disposition: A | Discharge status: Home / Self Care | Payer: Medicaid Other | Source: Ambulatory Visit | Attending: Family Medicine | Admitting: Family Medicine

## 2017-11-03 ENCOUNTER — Encounter (HOSPITAL_COMMUNITY): Payer: Self-pay

## 2017-11-13 ENCOUNTER — Encounter: Payer: Self-pay | Admitting: Family Medicine

## 2017-11-13 ENCOUNTER — Ambulatory Visit (INDEPENDENT_AMBULATORY_CARE_PROVIDER_SITE_OTHER): Payer: Medicaid Other | Admitting: Family Medicine

## 2017-11-13 VITALS — BP 138/90 | HR 88 | Wt 160.0 lb

## 2017-11-13 DIAGNOSIS — Z9889 Other specified postprocedural states: Secondary | ICD-10-CM

## 2017-11-13 DIAGNOSIS — Z5189 Encounter for other specified aftercare: Secondary | ICD-10-CM

## 2017-11-13 NOTE — Progress Notes (Signed)
   Subjective:    Patient ID: Maria Chapman, female    DOB: [birth year], 37 y.o.   MRN: [id number]  HPI Seen for wound check. Is 2 weeks s/p vaginal delivery and c/s for transverse baby B. No complications. Still has honeycomb dressing on.   Review of Systems     Objective:   Physical Exam  Constitutional: She appears well-developed and well-nourished.  Cardiovascular: Normal rate.  Pulmonary/Chest: Effort normal.  Abdominal:  Incision clean, dry, intact.  Skin: Skin is warm and dry.  Psychiatric: She has a normal mood and affect. Her behavior is normal. Judgment and thought content normal.      Assessment & Plan:  1. Visit for wound check Continue wound care. RTC if evidence of infection - signs reviewed with patient.

## 2017-12-04 ENCOUNTER — Encounter: Payer: Self-pay | Admitting: Family Medicine

## 2017-12-04 ENCOUNTER — Ambulatory Visit (INDEPENDENT_AMBULATORY_CARE_PROVIDER_SITE_OTHER): Payer: Medicaid Other | Admitting: Family Medicine

## 2017-12-04 DIAGNOSIS — Z1389 Encounter for screening for other disorder: Secondary | ICD-10-CM

## 2017-12-04 NOTE — Progress Notes (Signed)
Subjective:     Maria Chapman is a 37 y.o. female who presents for a postpartum visit. She is 5 weeks postpartum following a vaginal and cesarean. I have fully reviewed the prenatal and intrapartum course. The delivery was at 37 gestational weeks. Outcome: spontaneous vaginal delivery. Anesthesia: spinal. Postpartum course has been normal. Babies' course has been no. Baby is feeding by both breast and bottle - enfamil Gentle Ease . Bleeding no bleeding. Bowel function is normal. Bladder function is normal. Patient is not sexually active. Contraception method is uncertain. Postpartum depression screening: negative.  The following portions of the patient's history were reviewed and updated as appropriate: allergies, current medications, past family history, past medical history, past social history, past surgical history and problem list.  Review of Systems Pertinent items are noted in HPI.   Objective:    LMP 02/09/2017   General:  alert, cooperative and no distress  Lungs: clear to auscultation bilaterally  Heart:  regular rate and rhythm, S1, S2 normal, no murmur, click, rub or gallop  Abdomen: soft, non-tender; bowel sounds normal; no masses,  no organomegaly, Incision clean, dry, intact.        Assessment:     normal postpartum exam.  Plan:    1. Contraception: uncertain - discussed IUD vs nexplanon vs POP. 2. Follow up in: 3 months or as needed.

## 2017-12-04 NOTE — Patient Instructions (Signed)

## 2017-12-11 ENCOUNTER — Telehealth: Payer: Self-pay

## 2017-12-11 MED ORDER — CLINDAMYCIN HCL 300 MG PO CAPS: 300.0000 mg | ORAL_CAPSULE | Freq: Three times a day (TID) | ORAL | 0 refills | Status: DC

## 2017-12-11 NOTE — Telephone Encounter (Signed)
Patient called stating that she has an abscessed tooth. Her dentist cant see her until next week and the patient states her dentist told her she should call and request and antibiotic from her OBGYN to help the infection until she can be seen. Will ask Dr. Birdie Riddle RN

## 2018-05-18 ENCOUNTER — Telehealth: Payer: Self-pay

## 2018-05-18 MED ORDER — NORETHINDRONE 0.35 MG PO TABS: 1.0000 | ORAL_TABLET | Freq: Every day | ORAL | 3 refills | Status: DC

## 2018-05-18 NOTE — Telephone Encounter (Signed)
Patient is still breastfeeding and would like to start some birth control. Patient originally  stated she wanted Nuva ring but discuss this may suppress milk supply.  Patient states she would like progesterone only pill then. Will route to provider for review.  Pharmacy in system is correct. Armandina Stammer RN

## 2018-11-09 IMAGING — US US OB TRANSVAGINAL
1 series · 15 of 28 positions shown · non-contrast
Comparison: None.

CLINICAL DATA: Vaginal spotting

EXAM:
OBSTETRIC <14 WK US AND TRANSVAGINAL OB US
TECHNIQUE: Both transabdominal and transvaginal ultrasound examinations were
performed for complete evaluation of the gestation as well as the
maternal uterus, adnexal regions, and pelvic cul-de-sac.
Transvaginal technique was performed to assess early pregnancy.

[Series 1: us ob transvaginal · 15 of 62 slices shown]
[im 1/62]
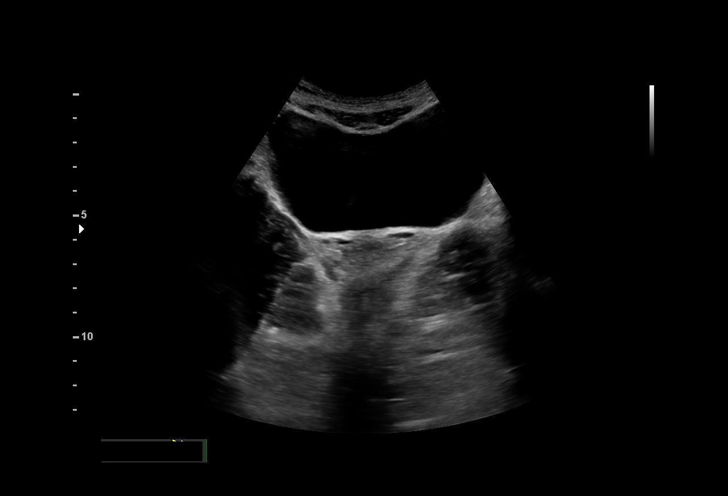
[im 5/62]
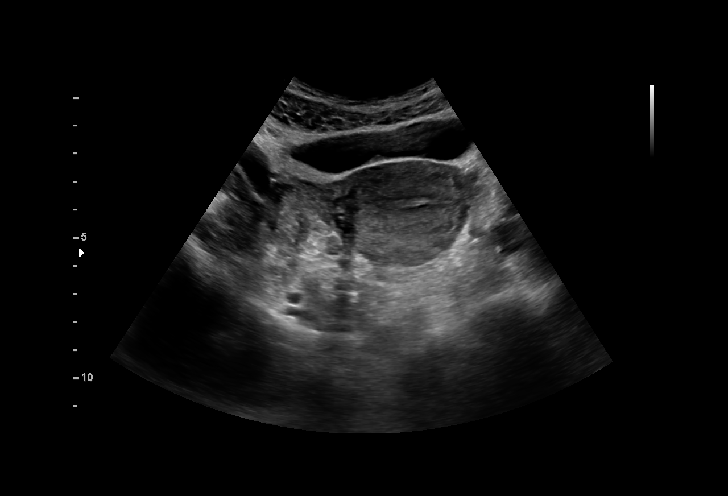
[im 10/62]
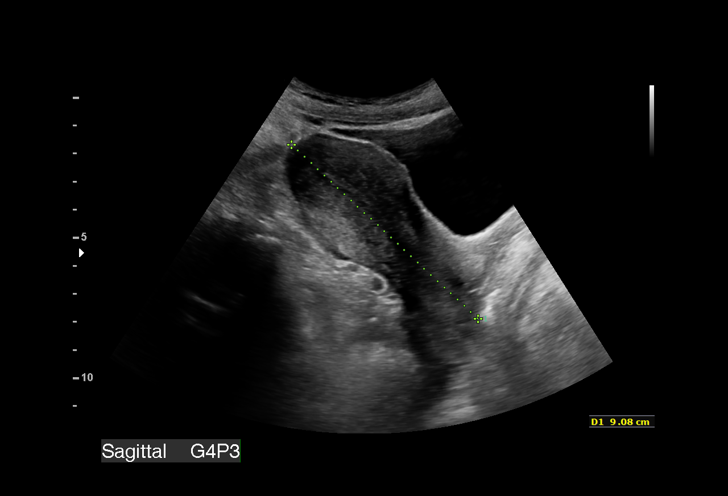
[im 14/62]
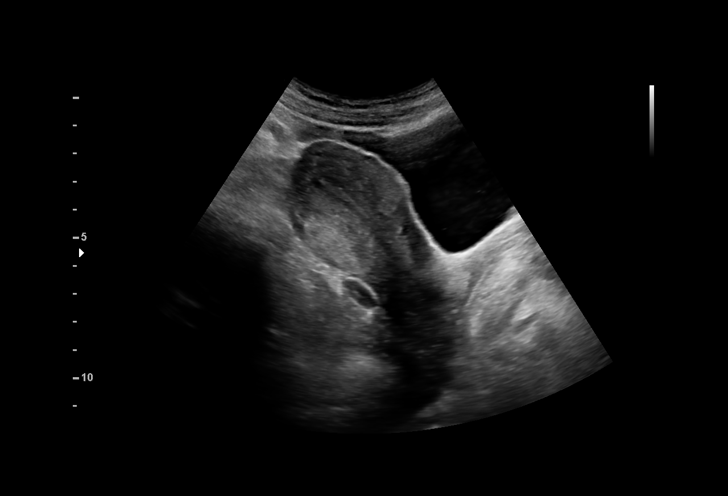
[im 19/62]
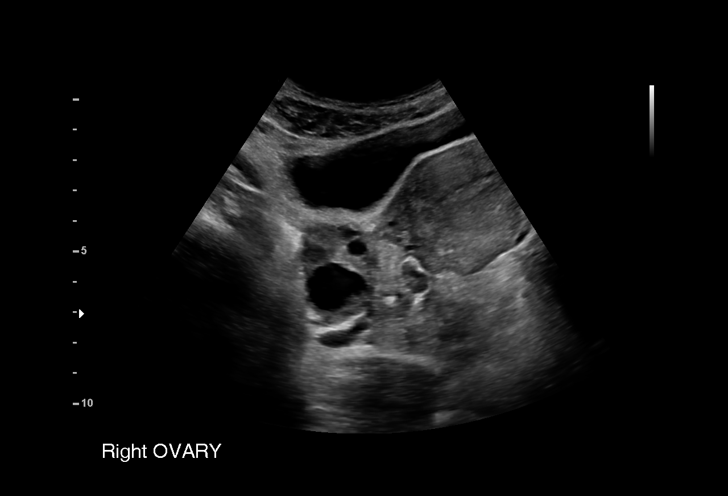
[im 23/62]
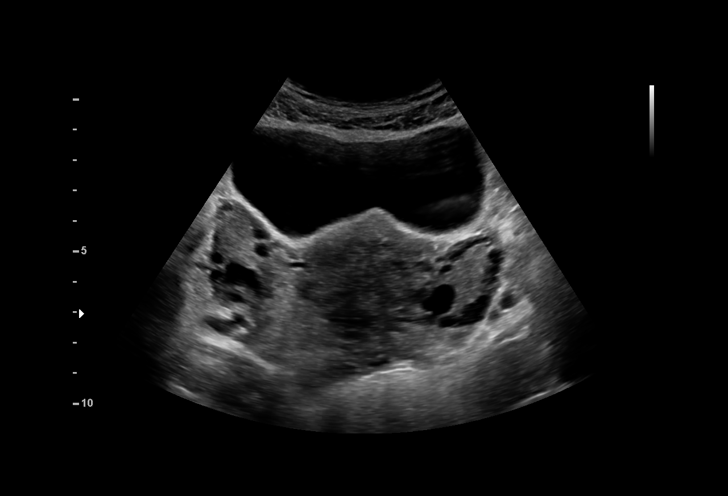
[im 28/62]
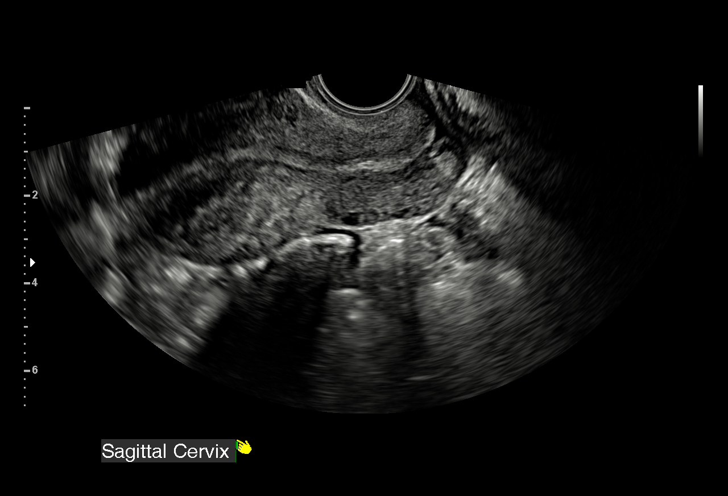
[im 32/62]
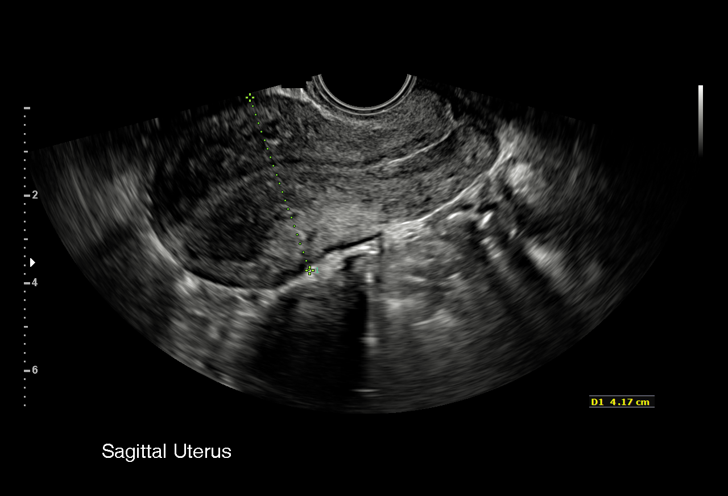
[im 34/62]
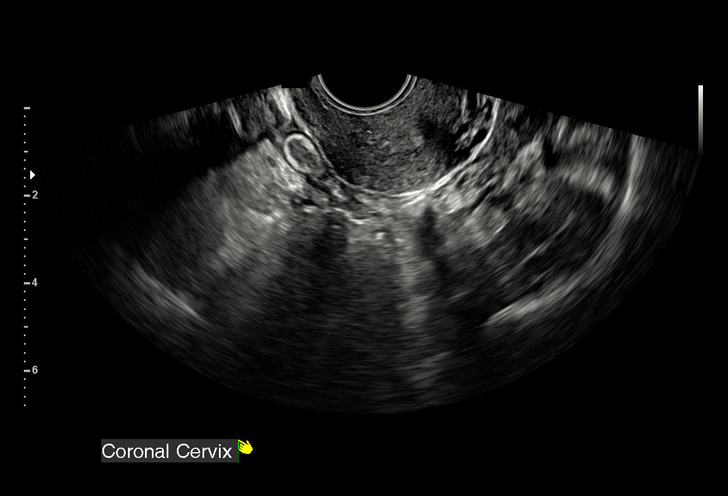
[im 39/62]
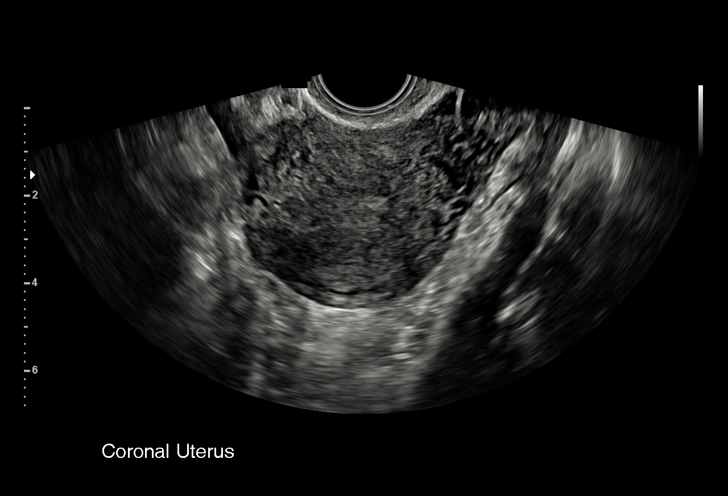
[im 43/62]
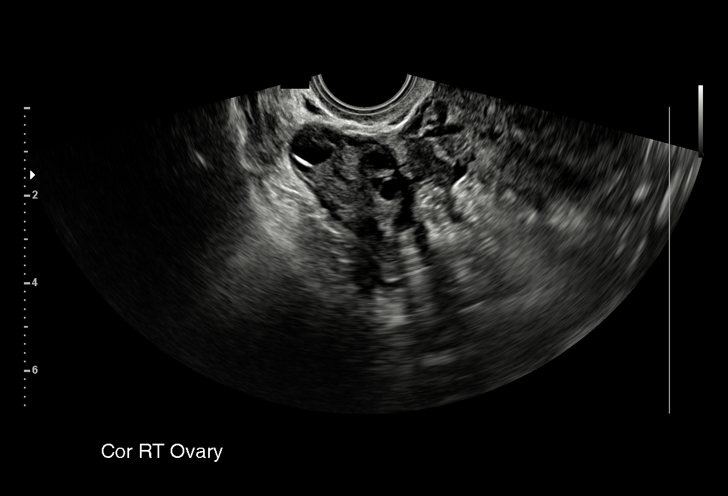
[im 48/62]
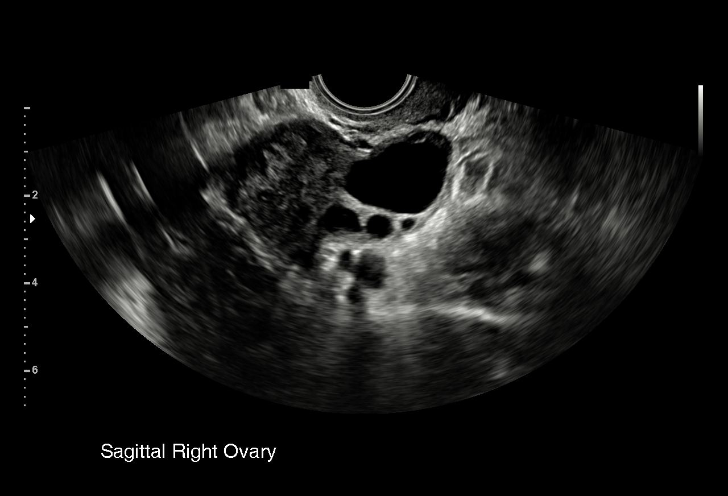
[im 52/62]
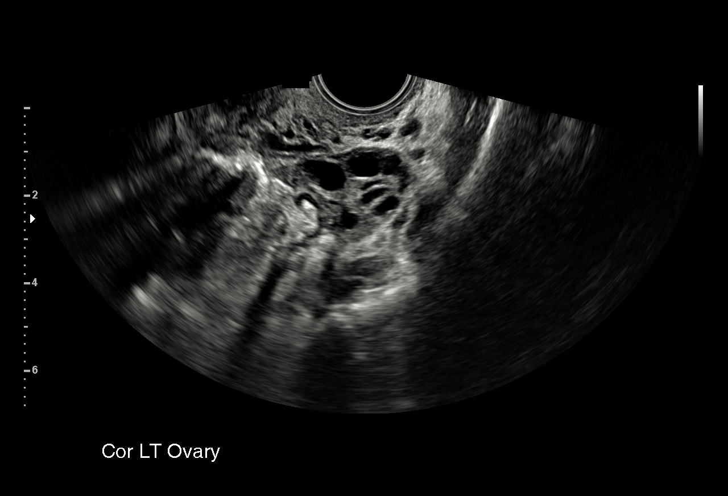
[im 57/62]
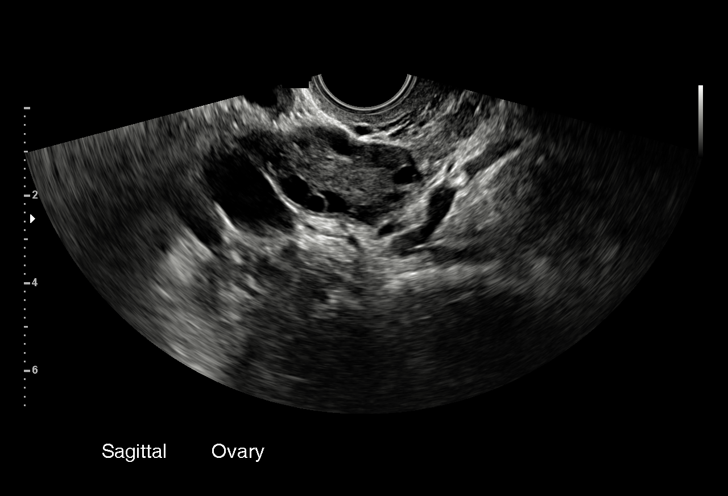
[im 62/62]
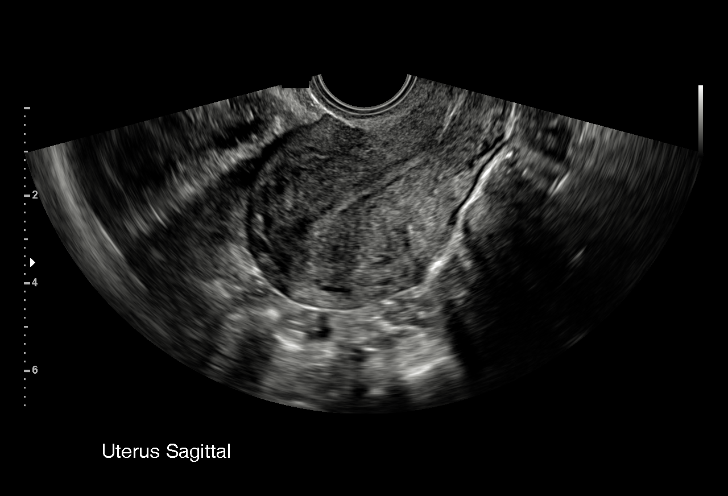

[15 of 28 positions shown; findings below may reference images not displayed]

FINDINGS: Intrauterine gestational sac: None visualized

Yolk sac:  Not visualized

Embryo:  Not visualized

Cardiac Activity:

Heart Rate:   bpm

MSD:   mm    w     d

CRL:    mm    w    d                  US EDC:

Subchorionic hemorrhage:  N/A

Maternal uterus/adnexae: No adnexal mass or free fluid.
IMPRESSION: No intrauterine pregnancy visualized. Differential considerations
would include early intrauterine pregnancy too early to visualize,
spontaneous abortion, or occult ectopic pregnancy. Recommend close
clinical followup and serial quantitative beta HCGs and ultrasounds.

## 2019-04-15 ENCOUNTER — Telehealth: Payer: Self-pay

## 2019-04-15 NOTE — Telephone Encounter (Signed)
Patient called stating that she would like to switch from the progestrone only pill to Nuva ring. Patient is done breastfeeding and that is why she is making the request. Approved switch to nuvaring with Dr. Erin Fulling. Patient has used nuvaring in the past and told me instructions on how she uses it. Will send script to pharmacy. Patient will follow up in a month or two for annual exam and follow up nuvaring. Armandina Stammer RN

## 2019-04-19 ENCOUNTER — Other Ambulatory Visit: Payer: Self-pay

## 2019-04-19 MED ORDER — ETONOGESTREL-ETHINYL ESTRADIOL 0.12-0.015 MG/24HR VA RING: VAGINAL_RING | VAGINAL | 12 refills | Status: DC

## 2019-05-13 ENCOUNTER — Ambulatory Visit (INDEPENDENT_AMBULATORY_CARE_PROVIDER_SITE_OTHER): Payer: Medicaid Other | Admitting: Family Medicine

## 2019-05-13 ENCOUNTER — Other Ambulatory Visit: Payer: Self-pay

## 2019-05-13 ENCOUNTER — Other Ambulatory Visit (HOSPITAL_COMMUNITY)
Admission: RE | Admit: 2019-05-13 | Discharge: 2019-05-13 | Disposition: A | Discharge status: Home / Self Care | Payer: Medicaid Other | Source: Ambulatory Visit | Attending: Family Medicine | Admitting: Family Medicine

## 2019-05-13 ENCOUNTER — Encounter: Payer: Self-pay | Admitting: Family Medicine

## 2019-05-13 VITALS — BP 133/84 | HR 101 | Ht 65.0 in | Wt 161.0 lb

## 2019-05-13 DIAGNOSIS — Z0000 Encounter for general adult medical examination without abnormal findings: Secondary | ICD-10-CM

## 2019-05-13 DIAGNOSIS — Z01419 Encounter for gynecological examination (general) (routine) without abnormal findings: Secondary | ICD-10-CM

## 2019-05-13 MED ORDER — ETONOGESTREL-ETHINYL ESTRADIOL 0.12-0.015 MG/24HR VA RING: VAGINAL_RING | VAGINAL | 3 refills | Status: AC

## 2019-05-13 NOTE — Progress Notes (Signed)
GYNECOLOGY ANNUAL PREVENTATIVE CARE ENCOUNTER NOTE  Subjective:   Maria Chapman is a 39 y.o. [phone] female here for a routine annual gynecologic exam.  Current complaints: none. Quite busy with 37yo twins, older kids, and is working.   Denies abnormal vaginal bleeding, discharge, pelvic pain, problems with intercourse or other gynecologic concerns.    Gynecologic History Patient's last menstrual period was 04/22/2019 (within days). Patient is sexually active  Contraception: NuvaRing vaginal inserts Last Pap: before 2017. Results were: normal Last mammogram: n/a  Obstetric History OB History  Gravida Para Term Preterm AB Living  6 4 4   2 5   SAB TAB Ectopic Multiple Live Births    1   1 5     # Outcome Date GA Lbr Len/2nd Weight Sex Delivery Anes PTL Lv  6A Term 10/28/17 [redacted]w[redacted]d 07:39 / 00:02 6 lb 2.9 oz (2.804 kg) M Vag-Spont None  LIV  6B Term 10/28/17 [redacted]w[redacted]d 07:39 / 01:42 5 lb 5.9 oz (2.435 kg) F CS-LTranv Spinal  LIV  5 Molar 12/2016             Birth Comments: System Generated. Please review and update pregnancy details.  4 Term 12/21/10    M Vag-Spont   LIV  3 Term 01/04/09   5 lb 14 oz (2.665 kg) F Vag-Spont   LIV  2 TAB 2006          1 Term 10/27/03   6 lb 9 oz (2.977 kg)  Vag-Spont       Past Medical History:  Diagnosis Date  . Syphilis     Past Surgical History:  Procedure Laterality Date  . CESAREAN SECTION N/A 10/28/2017   Procedure: CESAREAN SECTION;  Surgeon: Truett Mainland, DO;  Location: [facility];  Service: Obstetrics;  Laterality: N/A;  . NO PAST SURGERIES      Current Outpatient Medications on File Prior to Visit  Medication Sig Dispense Refill  . acetaminophen (TYLENOL) 500 MG tablet Take 1,000 mg by mouth every 6 (six) hours as needed for moderate pain or headache.    . clindamycin (CLEOCIN) 300 MG capsule Take 1 capsule (300 mg total) by mouth 3 (three) times daily. 21 capsule 0  . Prenatal Vit-Fe Fumarate-FA  (MULTIVITAMIN-PRENATAL) 27-0.8 MG TABS tablet Take 1 tablet by mouth daily at 12 noon.    . ranitidine (ZANTAC) 150 MG tablet Take 150 mg by mouth daily as needed for heartburn.     No current facility-administered medications on file prior to visit.    Allergies  Allergen Reactions  . Penicillin G     Hives  . Penicillins Hives    Has patient had a PCN reaction causing immediate rash, facial/tongue/throat swelling, SOB or lightheadedness with hypotension: No Has patient had a PCN reaction causing severe rash involving mucus membranes or skin necrosis: No Has patient had a PCN reaction that required hospitalization: No Has patient had a PCN reaction occurring within the last 10 years: No If all of the above answers are "NO", then may proceed with Cephalosporin use.     Social History   Socioeconomic History  . Marital status: Single    Spouse name: Not on file  . Number of children: Not on file  . Years of education: Not on file  . Highest education level: Not on file  Occupational History  . Not on file  Tobacco Use  . Smoking status: Former Research scientist (life sciences)  . Smokeless tobacco: Never Used  Substance  and Sexual Activity  . Alcohol use: No  . Drug use: Not Currently    Types: Marijuana  . Sexual activity: Yes  Other Topics Concern  . Not on file  Social History Narrative  . Not on file   Social Determinants of Health   Financial Resource Strain:   . Difficulty of Paying Living Expenses:   Food Insecurity:   . Worried About Programme researcher, broadcasting/film/video in the Last Year:   . Barista in the Last Year:   Transportation Needs:   . Freight forwarder (Medical):   Marland Kitchen Lack of Transportation (Non-Medical):   Physical Activity:   . Days of Exercise per Week:   . Minutes of Exercise per Session:   Stress:   . Feeling of Stress :   Social Connections:   . Frequency of Communication with Friends and Family:   . Frequency of Social Gatherings with Friends and Family:   .  Attends Religious Services:   . Active Member of Clubs or Organizations:   . Attends Banker Meetings:   Marland Kitchen Marital Status:   Intimate Partner Violence:   . Fear of Current or Ex-Partner:   . Emotionally Abused:   Marland Kitchen Physically Abused:   . Sexually Abused:     Family History  Problem Relation Age of Onset  . Cancer Maternal Aunt 30       breast CA  . Diabetes Maternal Grandmother   . Cancer Maternal Grandfather   . Hypertension Neg Hx     The following portions of the patient's history were reviewed and updated as appropriate: allergies, current medications, past family history, past medical history, past social history, past surgical history and problem list.  Review of Systems Pertinent items are noted in HPI.   Objective:  BP 133/84   Pulse (!) 101   Ht 5\' 5"  (1.651 m)   Wt 161 lb (73 kg)   LMP 04/22/2019 (Within Days)   BMI 26.79 kg/m  Wt Readings from Last 3 Encounters:  05/13/19 161 lb (73 kg)  12/04/17 156 lb (70.8 kg)  11/13/17 160 lb (72.6 kg)     Chaperone present during exam  CONSTITUTIONAL: Well-developed, well-nourished female in no acute distress.  HENT:  Normocephalic, atraumatic, External right and left ear normal. Oropharynx is clear and moist EYES: Conjunctivae and EOM are normal. Pupils are equal, round, and reactive to light. No scleral icterus.  NECK: Normal range of motion, supple, no masses.  Normal thyroid.   CARDIOVASCULAR: Normal heart rate noted, regular rhythm RESPIRATORY: Clear to auscultation bilaterally. Effort and breath sounds normal, no problems with respiration noted. BREASTS: Symmetric in size. No masses, skin changes, nipple drainage, or lymphadenopathy. ABDOMEN: Soft, normal bowel sounds, no distention noted.  No tenderness, rebound or guarding.  PELVIC: Normal appearing external genitalia; normal appearing vaginal mucosa and cervix.  No abnormal discharge noted.  Normal uterine size, no other palpable masses, no  uterine or adnexal tenderness. MUSCULOSKELETAL: Normal range of motion. No tenderness.  No cyanosis, clubbing, or edema.  2+ distal pulses. SKIN: Skin is warm and dry. No rash noted. Not diaphoretic. No erythema. No pallor. NEUROLOGIC: Alert and oriented to person, place, and time. Normal reflexes, muscle tone coordination. No cranial nerve deficit noted. PSYCHIATRIC: Normal mood and affect. Normal behavior. Normal judgment and thought content.  Assessment:  Annual gynecologic examination with pap smear   Plan:  1. Well Woman Exam Will follow up results of pap smear and manage  accordingly. Mammogram scheduled STD testing discussed. Patient declined testing Nuvaring refilled. - Cytology - PAP( [facility])   Routine preventative health maintenance measures emphasized. Please refer to After Visit Summary for other counseling recommendations.    Candelaria Celeste, DO [facility]

## 2019-05-19 LAB — CYTOLOGY - PAP
Comment: NEGATIVE
Diagnosis: NEGATIVE
High risk HPV: NEGATIVE

## 2019-08-19 IMAGING — US USMFM FETAL BPP W/O NON-STRESS ADDL GEST
1 series · 12 of 28 positions shown · non-contrast
Comparison: none

[Series 1: usmfm fetal bpp w/o non-stress addl gest · 36 acquisitions, 12 frames shown]
[im 2/36]
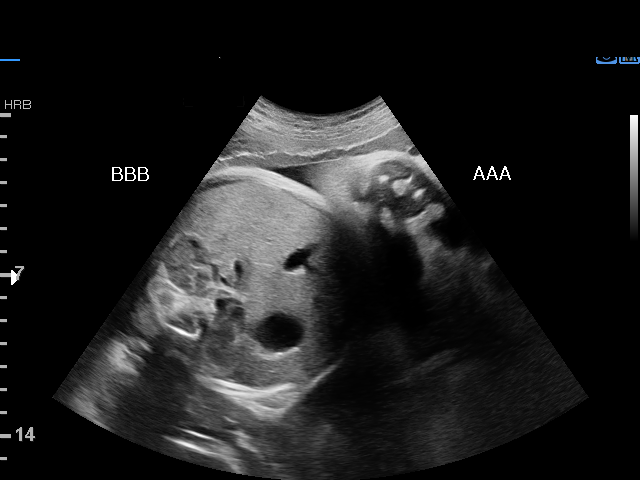
[im 4/36]
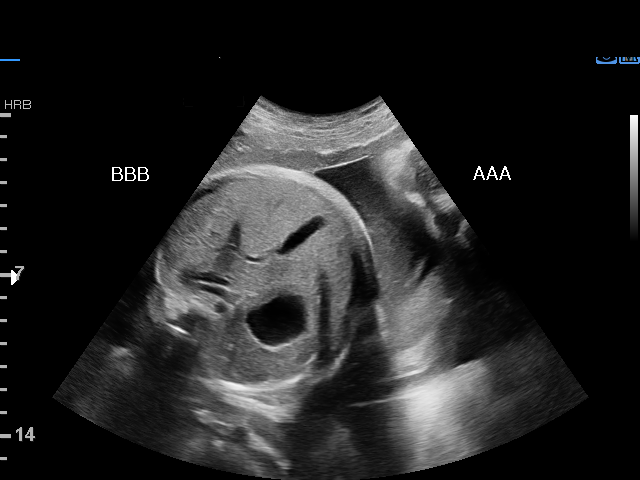
[im 7/36]
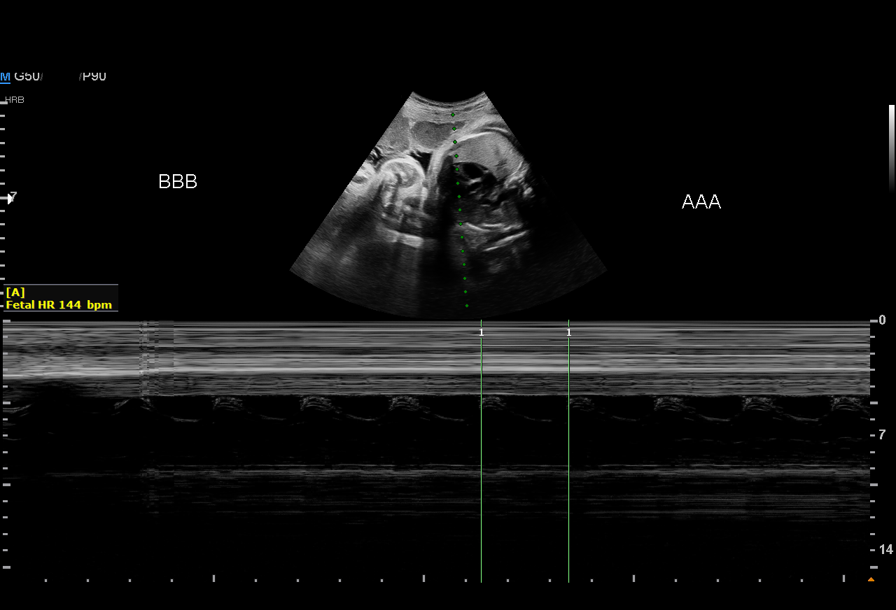
[im 11/36]
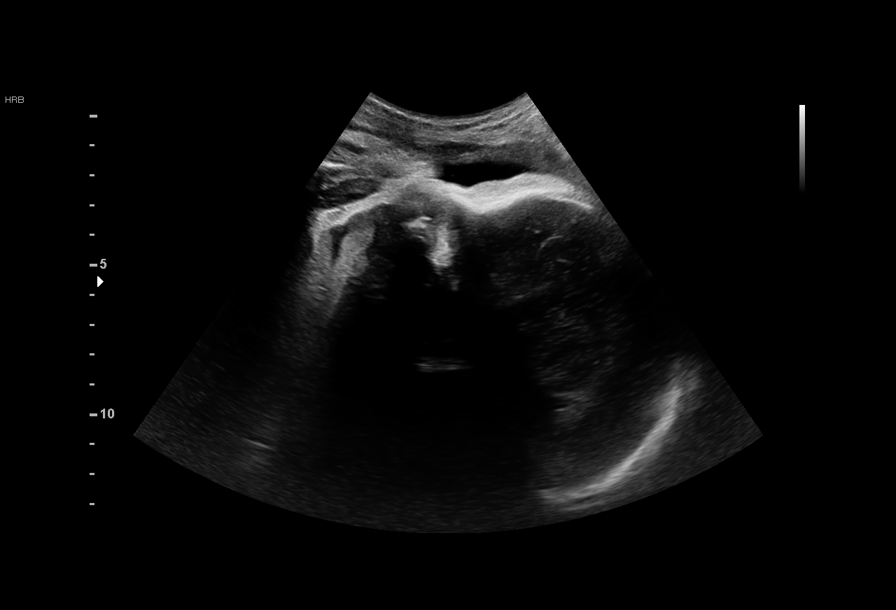
[im 13/36]
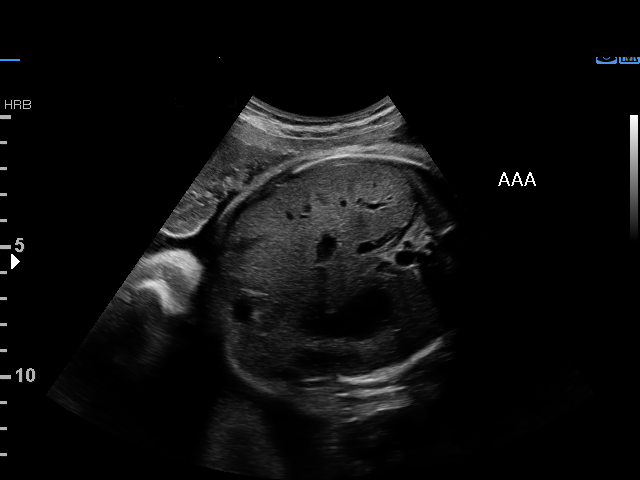
[im 16/36]
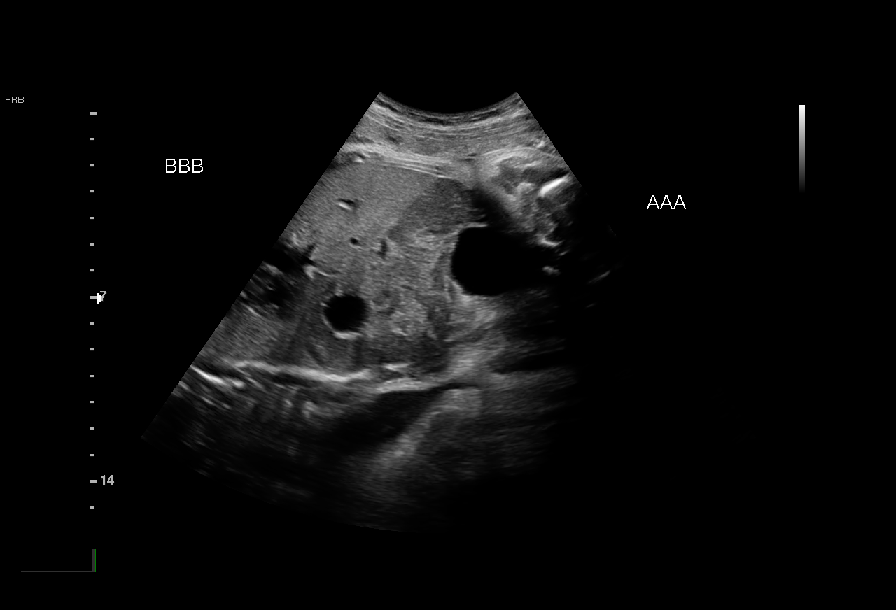
[im 20/36]
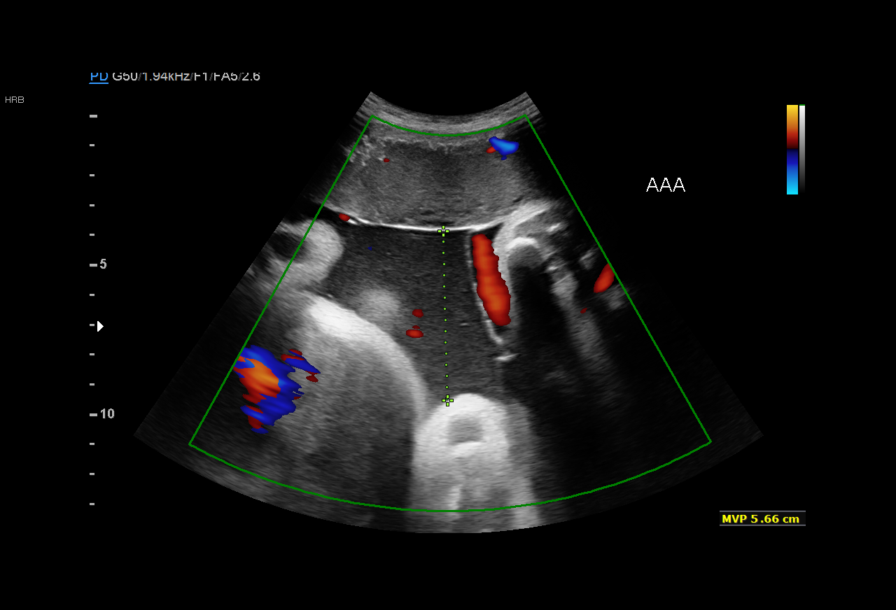
[im 23/36]
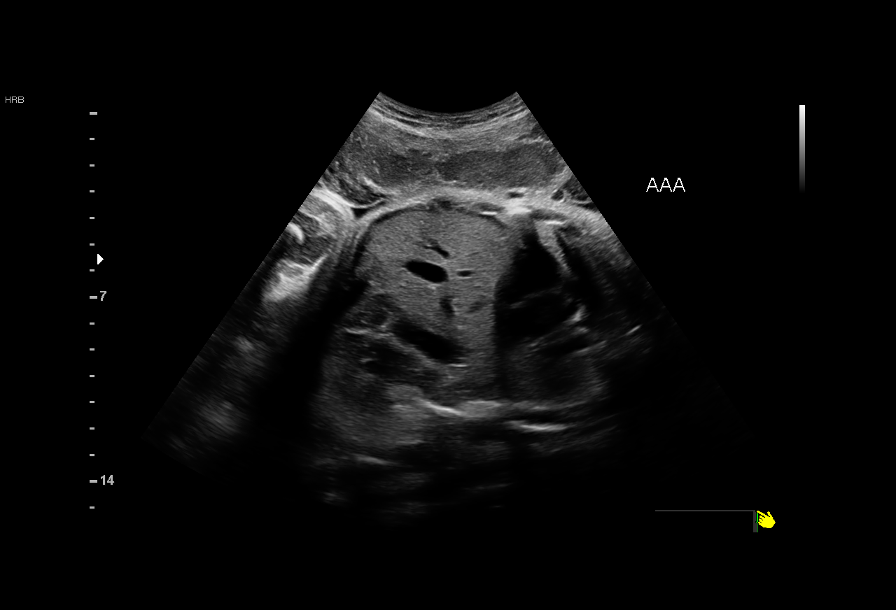
[im 25/36]
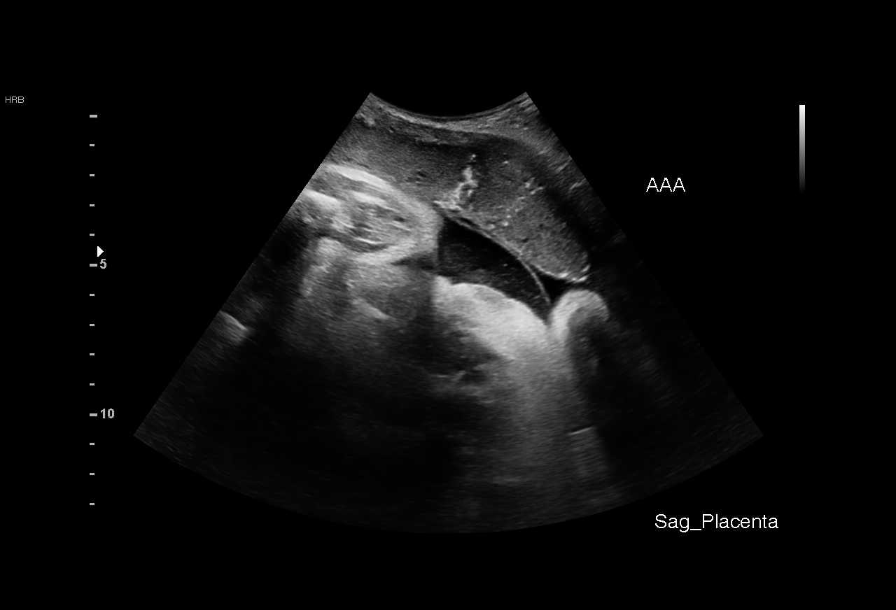
[im 29/36]
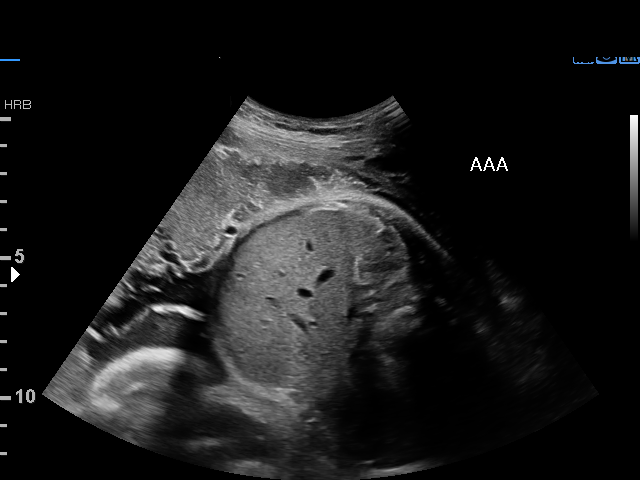
[im 32/36]
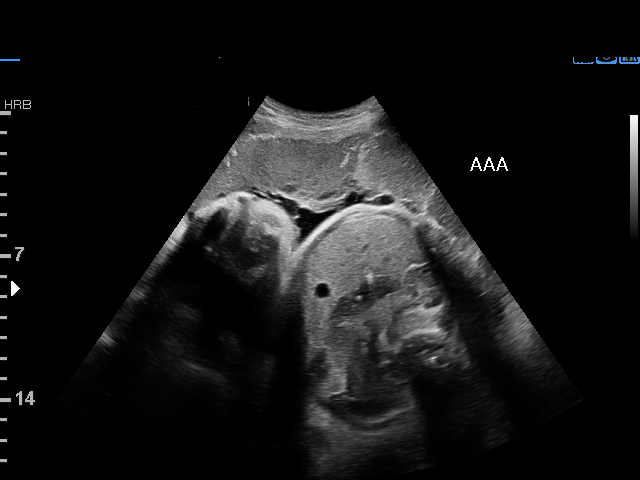
[im 34/36]
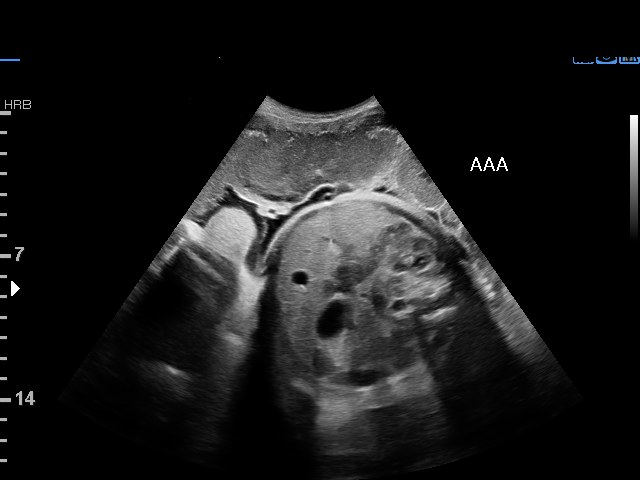

[12 of 28 positions shown; findings below may reference images not displayed]

ADDL GESTATION

Indications

37 weeks gestation of pregnancy
Twin pregnancy, di/di, third trimester
Molar pregnancy (previous pregnancy,
Marijuana Abuse
Advanced maternal age multigravida 35+,
third trimester (Neg Quad)
Vital Signs

BMI:
Fetal Evaluation (Fetus A)

Num Of Fetuses:         2
Fetal Heart Rate(bpm):  144
Cardiac Activity:       Observed
Fetal Lie:              Lower Fetus, Mat Lt
Presentation:           Cephalic

Amniotic Fluid
AFI FV:      Within normal limits

Largest Pocket(cm)
3.42
Biophysical Evaluation (Fetus A)

Amniotic F.V:   Within normal limits       F. Tone:        Observed
F. Movement:    Observed                   Score:          [DATE]
F. Breathing:   Observed
OB History

Gravidity:    6         Term:   3        Prem:   0        SAB:   1
TOP:          1       Ectopic:  0        Living: 3
Gestational Age (Fetus A)

LMP:           37w 1d        Date:  02/09/17                 EDD:   11/16/17
Best:          37w 1d     Det. By:  LMP  (02/09/17)          EDD:   11/16/17
Anatomy (Fetus A)

Thoracic:              Appears normal         Abdomen:                Appears normal
Stomach:               Appears normal, left   Bladder:                Appears normal
sided

Fetal Evaluation (Fetus B)

Num Of Fetuses:         2
Fetal Heart Rate(bpm):  146
Cardiac Activity:       Observed
Fetal Lie:              Upper Fetus, Mat Rt
Presentation:           Breech

Amniotic Fluid
AFI FV:      Within normal limits

Largest Pocket(cm)
5.66
Biophysical Evaluation (Fetus B)

Amniotic F.V:   Within normal limits       F. Tone:        Observed
F. Movement:    Observed                   Score:          [DATE]
F. Breathing:   Observed
Gestational Age (Fetus B)

LMP:           37w 1d        Date:  02/09/17                 EDD:   11/16/17
Best:          37w 1d     Det. By:  LMP  (02/09/17)          EDD:   11/16/17
Anatomy (Fetus B)

Thoracic:              Appears normal         Abdomen:                Appears normal
Stomach:               Appears normal, left   Bladder:                Appears normal
sided
Cervix Uterus Adnexa

Cervix
Not visualized (advanced GA >03wks)
Comments

U/S images reviewed. Findings reviewed with patient.   No
evidence of fetal compromise is found on BPP x 2 today.  No
fetal abnormalities are seen.
Questions answered.
10 minutes spent face to face with patient.
Recommendations: 1) Follow-up BPP in 1 week if undelivered
Recommendations

1) Follow-up BPP in 1 week if undelivered

## 2021-12-03 ENCOUNTER — Inpatient Hospital Stay (HOSPITAL_COMMUNITY): Admission: AD | Admit: 2021-12-03 | Payer: Medicaid Other | Admitting: Obstetrics and Gynecology

## 2023-09-29 NOTE — Progress Notes (Signed)
 Subjective   Patient ID:  Maria Chapman is a 43 y.o. (DOB September 01, 1980) female No chief complaint on file.    HPI History of Present Illness The patient presents for evaluation of stress, weight loss, and birth control.  She has been experiencing significant life changes, including separation from her husband and moving back to her parents' house. She is currently in the process of divorce. Her daughter, who had previously moved in with her, has since left due to conflicts with her with her and her ex husband. She feels overwhelmed by these events and struggles to cope with the emotional burden. She is also dealing with the stress of raising four other children. She is unsure if the stress is affecting her physically or if there is an underlying issue causing her weight loss. She has not sought therapy or counseling due to concerns about mandatory reporting. She is considering changing her work schedule to better accommodate her children's needs and reduce her stress levels. 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 She has never taken medication for stress management and is hesitant to start now. She often feels unmotivated and struggles to get out of bed. She finds comfort in her children and tends to overthink things. She has not tried natural remedies like ashwagandha.  Over the past 2 to 3 months, she regained all her weight, reaching 152 pounds. However, she recently lost 10 pounds in just 2 weeks. She reports a lack of appetite and difficulty managing her stress. She works long hours on the third shift, which disrupts her sleep schedule. She took some time off work to rest and recover.  She is seeking birth control options.    Reviewed and updated this visit by provider: Tobacco  Allergies  Meds  Problems  Med Hx  Surg Hx  Fam Hx        Review of Systems  is complete and negative except as noted.   Objective    Vitals:   09/26/23 1617  BP: 126/60  Patient Position: Sitting  Pulse: 107  Temp: 97.8 F (36.6 C)  TempSrc: Skin  Resp: 18  Height: 5' 5 (1.651 m)  Weight: 140 lb 12.8 oz (63.9 kg)  SpO2: 98%  BMI (Calculated): 23.4  PainSc: 0-No pain     Physical Exam Vitals and nursing note reviewed.  Constitutional:      General: She is not in acute distress.    Appearance: Normal appearance.  Cardiovascular:     Rate and Rhythm: Normal rate and regular rhythm.     Pulses: Normal pulses.     Heart sounds: Normal heart sounds. No murmur heard. Pulmonary:     Effort: Pulmonary effort is normal. No respiratory distress.     Breath sounds: Normal breath sounds.  Skin:    General: Skin is warm and dry.     Capillary Refill: Capillary refill takes less than 2 seconds.  Neurological:     Mental Status: She is alert and oriented to person, place, and time. Mental status is at baseline.  Psychiatric:        Attention and Perception: Attention and perception normal.        Mood and Affect: Mood is depressed. Affect is tearful.        Speech: Speech normal.        Behavior: Behavior normal.        Thought Content: Thought content normal.  Assessment and Plan  1. Weight loss (Primary) -     Comprehensive Metabolic Panel; Future -     TSH Reflex T4Free/T3 Hormone; Future -     Vitamin D 25 Hydroxy; Future -     Vitamin B12; Future; Expected date: 09/27/2023 -     CBC And Differential; Future -     Hemoglobin A1c; Future -     T3+T4F 2. Stress at home -     Comprehensive Metabolic Panel; Future -     CBC And Differential; Future 3. Screening examination for STD (sexually transmitted disease) -     HIV-1/O/2, 4th Generation; Future -     Syphilis: Treponemal Antibodies With Rfxx to RPR and RPR Titer, Reverse Screening and Diagnosis Algorithm; Future 4. High risk heterosexual behavior -     HIV-1/O/2, 4th Generation; Future -     Syphilis: Treponemal Antibodies With Rfxx to RPR  and RPR Titer, Reverse Screening and Diagnosis Algorithm; Future 5. Encounter for initial prescription of vaginal ring hormonal contraceptive -     POCT Pregnancy, Urine -     etonogestrel -ethinyl estradiol  (NUVARING) 0.12-0.015 MG/24HR vaginal ring; Place one each vaginally every 28 (twenty-eight) days. Insert vaginally and leave in place for 3 consecutive weeks, then remove for 1 week., Starting Fri 09/26/2023, Until Sat 09/25/2024, Normal    Assessment & Plan 1. Stress: - Her stress levels are significantly elevated, likely due to unprocessed trauma. Depression screening score is 9, indicating mild depression, while anxiety screening score is 6, suggesting mild anxiety. - She was advised to seek counseling or therapy for emotional processing and to address feelings of guilt. The use of ashwagandha and magnesium glycinate was recommended. - If symptoms persist or worsen, she should contact the office.  2. Weight loss: - She has experienced a weight loss of 10 pounds in 2 weeks. This could be related to stress or an underlying condition. - Blood tests, including TSH, CBC, CMP, vitamin B12, and vitamin D, will be conducted to rule out any underlying conditions such as hyperthyroidism.  3. Birth control: - NuvaRing will be prescribed as she has used it before and found it effective. - negative urine pregnancy today - Risk and benefits of medication reviewed today.   4. Screening for sexually transmitted diseases: - STD testing for HIV and syphilis will be conducted.  Follow-up: A follow-up visit is scheduled in 4 weeks.  Follow up in about 4 weeks (around 10/24/2023) for STRESS, weight loss, contraceptive-15min .  Computer technology was used to create visit note.  Consent from the patient/caregiver was obtained prior to its use.      Patient's Medications  New Prescriptions   No medications on file  Previous Medications   METHYLPREDNISOLONE (MEDROL DOSEPACK) 4 MG TABLET    follow  package directions  Modified Medications   Modified Medication Previous Medication   ETONOGESTREL -ETHINYL ESTRADIOL  (NUVARING) 0.12-0.015 MG/24HR VAGINAL RING etonogestrel -ethinyl estradiol  (NUVARING) 0.12-0.015 MG/24HR vaginal ring      Place one each vaginally every 28 (twenty-eight) days. Insert vaginally and leave in place for 3 consecutive weeks, then remove for 1 week.    Place 1 each vaginally every 28 (twenty-eight) days. Insert vaginally and leave in place for 3 consecutive weeks, then remove for 1 week.  Discontinued Medications   No medications on file        Risks, benefits, and alternatives of the medications and treatment plan prescribed today were discussed, and patient expressed understanding. Plan follow-up as discussed or  as needed if any worsening symptoms or change in condition.   A yearly preventative health exam was recommended and current age based recommendations were discussed.

## 2023-10-14 ENCOUNTER — Other Ambulatory Visit (HOSPITAL_COMMUNITY)
Admission: RE | Admit: 2023-10-14 | Discharge: 2023-10-14 | Disposition: A | Discharge status: Home / Self Care | Source: Ambulatory Visit | Attending: Obstetrics and Gynecology | Admitting: Obstetrics and Gynecology

## 2023-10-14 ENCOUNTER — Ambulatory Visit: Admitting: Obstetrics and Gynecology

## 2023-10-14 VITALS — BP 118/80 | HR 70 | Ht 65.0 in | Wt 142.0 lb

## 2023-10-14 DIAGNOSIS — F32A Depression, unspecified: Secondary | ICD-10-CM

## 2023-10-14 DIAGNOSIS — Z124 Encounter for screening for malignant neoplasm of cervix: Secondary | ICD-10-CM | POA: Diagnosis present

## 2023-10-14 DIAGNOSIS — Z01419 Encounter for gynecological examination (general) (routine) without abnormal findings: Secondary | ICD-10-CM

## 2023-10-14 DIAGNOSIS — Z1331 Encounter for screening for depression: Secondary | ICD-10-CM | POA: Diagnosis not present

## 2023-10-14 DIAGNOSIS — Z1231 Encounter for screening mammogram for malignant neoplasm of breast: Secondary | ICD-10-CM

## 2023-10-14 NOTE — Progress Notes (Unsigned)
   ANNUAL EXAM Patient name: Maria Chapman MRN [id number]  Date of birth: July 18, 1980 Chief Complaint:   Gynecologic Exam  History of Present Illness:   Maria Chapman is a 43 y.o. [phone]  female being seen today for a routine annual exam.  Current complaints: currently using nuvaring for birth control, desires to stick with it  Spoke with someone about stress and depression, feels she is doing a little better   Patient's last menstrual period was 09/11/2023.   Gynecologic History Patient's last menstrual period was . Contraception: Nuvaring Last Pap:2021 . Results were: Normal Last mammogram: n/a      No data to display               No data to display           Review of Systems:   Pertinent items are noted in HPI Denies any headaches, blurred vision, fatigue, shortness of breath, chest pain, abdominal pain, abnormal vaginal discharge/itching/odor/irritation, problems with periods, bowel movements, urination, or intercourse unless otherwise stated above. Pertinent History Reviewed:  Reviewed past medical,surgical, social and family history.  Reviewed problem list, medications and allergies. Physical Assessment:   Vitals:   10/14/23 0941  BP: 118/80  Pulse: 70  Weight: 142 lb (64.4 kg)  Height: 5' 5 (1.651 m)  Body mass index is 23.63 kg/m.        Physical Examination:   General appearance - well appearing, and in no distress  Mental status - alert, oriented   Psych:  She has a normal mood and affect  Skin - warm and dry, normal color  Chest - effort normal, all lung fields clear to auscultation bilaterally  Heart - normal rate and regular rhythm  Neck:  midline trachea, no thyromegaly or nodules  Breasts - breasts appear normal, no suspicious masses, no skin or nipple changes or  axillary nodes  Abdomen - soft, nontender, nondistended  Pelvic - VULVA: normal appearing vulva with no masses, tenderness or lesions  VAGINA: normal  appearing vagina with normal color and discharge, no lesions  CERVIX: normal appearing cervix without discharge or lesions, no CMT  Thin prep pap is done w HR HPV cotesting  UTERUS: uterus is felt to be normal size, shape, consistency and nontender   ADNEXA: No adnexal masses or tenderness noted.  Extremities:  No swelling or varicosities noted  Chaperone present for exam  No results found for this or any previous visit (from the past 24 hours).  Assessment & Plan:  1. Encounter for annual routine gynecological examination (Primary) Continue nuvaring Pap today Ordered mammogram   2. Cervical cancer screening  - Cytology - PAP  3. Encounter for screening mammogram for malignant neoplasm of breast  - MM 3D SCREENING MAMMOGRAM BILATERAL BREAST; Future  4. Depression, unspecified depression type Desires counseling visit  - Ambulatory referral to Integrated [facility]    Labs/procedures today:   Mammogram: schedule screening mammo as soon as possible, or sooner if problems Colonoscopy: @ 41yo, or sooner if problems  Orders Placed This Encounter  Procedures   MM 3D SCREENING MAMMOGRAM BILATERAL BREAST   Ambulatory referral to Integrated [facility]    Meds: No orders of the defined types were placed in this encounter.   Follow-up: Return in about 1 year (around 10/13/2024), or schedule  counseling, for [place] Nidia Delores, FNP

## 2023-10-15 LAB — CYTOLOGY - PAP
Comment: NEGATIVE
Diagnosis: NEGATIVE
High risk HPV: NEGATIVE

## 2023-10-16 ENCOUNTER — Ambulatory Visit: Payer: Self-pay | Admitting: Obstetrics and Gynecology

## 2023-10-23 ENCOUNTER — Encounter: Payer: Self-pay | Admitting: Licensed Clinical Social Worker

## 2023-11-25 ENCOUNTER — Inpatient Hospital Stay (HOSPITAL_BASED_OUTPATIENT_CLINIC_OR_DEPARTMENT_OTHER): Admission: RE | Admit: 2023-11-25 | Source: Ambulatory Visit

## 2023-12-09 ENCOUNTER — Encounter (HOSPITAL_BASED_OUTPATIENT_CLINIC_OR_DEPARTMENT_OTHER): Payer: Self-pay

## 2023-12-09 ENCOUNTER — Ambulatory Visit (HOSPITAL_BASED_OUTPATIENT_CLINIC_OR_DEPARTMENT_OTHER)
Admission: RE | Admit: 2023-12-09 | Discharge: 2023-12-09 | Disposition: A | Discharge status: Home / Self Care | Source: Ambulatory Visit | Attending: Obstetrics and Gynecology | Admitting: Obstetrics and Gynecology

## 2023-12-09 DIAGNOSIS — Z1231 Encounter for screening mammogram for malignant neoplasm of breast: Secondary | ICD-10-CM | POA: Insufficient documentation

## 2023-12-12 ENCOUNTER — Other Ambulatory Visit: Payer: Self-pay | Admitting: Obstetrics and Gynecology

## 2023-12-12 DIAGNOSIS — R928 Other abnormal and inconclusive findings on diagnostic imaging of breast: Secondary | ICD-10-CM

## 2023-12-25 ENCOUNTER — Ambulatory Visit: Payer: Self-pay | Admitting: Obstetrics and Gynecology

## 2023-12-25 ENCOUNTER — Ambulatory Visit
Admission: RE | Admit: 2023-12-25 | Discharge: 2023-12-25 | Disposition: A | Discharge status: Home / Self Care | Source: Ambulatory Visit | Attending: Obstetrics and Gynecology | Admitting: Obstetrics and Gynecology

## 2023-12-25 DIAGNOSIS — R928 Other abnormal and inconclusive findings on diagnostic imaging of breast: Secondary | ICD-10-CM
